# Patient Record
Sex: Female | Born: 1940
Health system: Southern US, Community
[De-identification: ages and names within clinical notes are randomized; demographics above are authoritative.]

## PROBLEM LIST (undated history)

## (undated) DIAGNOSIS — I639 Cerebral infarction, unspecified: Secondary | ICD-10-CM

## (undated) DIAGNOSIS — G8929 Other chronic pain: Secondary | ICD-10-CM

## (undated) DIAGNOSIS — Z87442 Personal history of urinary calculi: Secondary | ICD-10-CM

## (undated) DIAGNOSIS — F329 Major depressive disorder, single episode, unspecified: Secondary | ICD-10-CM

## (undated) DIAGNOSIS — E0590 Thyrotoxicosis, unspecified without thyrotoxic crisis or storm: Secondary | ICD-10-CM

## (undated) DIAGNOSIS — G43909 Migraine, unspecified, not intractable, without status migrainosus: Secondary | ICD-10-CM

## (undated) DIAGNOSIS — I10 Essential (primary) hypertension: Secondary | ICD-10-CM

## (undated) DIAGNOSIS — M797 Fibromyalgia: Secondary | ICD-10-CM

## (undated) DIAGNOSIS — F32A Depression, unspecified: Secondary | ICD-10-CM

## (undated) DIAGNOSIS — I82409 Acute embolism and thrombosis of unspecified deep veins of unspecified lower extremity: Secondary | ICD-10-CM

## (undated) DIAGNOSIS — N189 Chronic kidney disease, unspecified: Secondary | ICD-10-CM

## (undated) DIAGNOSIS — G459 Transient cerebral ischemic attack, unspecified: Secondary | ICD-10-CM

## (undated) DIAGNOSIS — M545 Low back pain, unspecified: Secondary | ICD-10-CM

## (undated) DIAGNOSIS — E119 Type 2 diabetes mellitus without complications: Secondary | ICD-10-CM

## (undated) DIAGNOSIS — R569 Unspecified convulsions: Secondary | ICD-10-CM

## (undated) DIAGNOSIS — J189 Pneumonia, unspecified organism: Secondary | ICD-10-CM

## (undated) HISTORY — PX: TOTAL ABDOMINAL HYSTERECTOMY: SHX209

## (undated) HISTORY — PX: HERNIA REPAIR: SHX51

## (undated) HISTORY — PX: UMBILICAL HERNIA REPAIR: SHX196

## (undated) HISTORY — PX: CARPAL TUNNEL RELEASE: SHX101

## (undated) HISTORY — PX: DILATION AND CURETTAGE OF UTERUS: SHX78

## (undated) HISTORY — PX: CHOLECYSTECTOMY OPEN: SUR202

## (undated) HISTORY — PX: HEMORRHOID SURGERY: SHX153

---

## 2011-08-07 DIAGNOSIS — R112 Nausea with vomiting, unspecified: Secondary | ICD-10-CM | POA: Diagnosis not present

## 2011-08-07 DIAGNOSIS — M25519 Pain in unspecified shoulder: Secondary | ICD-10-CM | POA: Diagnosis not present

## 2011-08-07 DIAGNOSIS — E039 Hypothyroidism, unspecified: Secondary | ICD-10-CM | POA: Diagnosis not present

## 2011-08-07 DIAGNOSIS — E119 Type 2 diabetes mellitus without complications: Secondary | ICD-10-CM | POA: Diagnosis not present

## 2011-09-05 DIAGNOSIS — L03319 Cellulitis of trunk, unspecified: Secondary | ICD-10-CM | POA: Diagnosis not present

## 2011-09-05 DIAGNOSIS — M67919 Unspecified disorder of synovium and tendon, unspecified shoulder: Secondary | ICD-10-CM | POA: Diagnosis not present

## 2011-09-05 DIAGNOSIS — L02219 Cutaneous abscess of trunk, unspecified: Secondary | ICD-10-CM | POA: Diagnosis not present

## 2011-09-05 DIAGNOSIS — L0390 Cellulitis, unspecified: Secondary | ICD-10-CM | POA: Diagnosis not present

## 2011-09-05 DIAGNOSIS — M719 Bursopathy, unspecified: Secondary | ICD-10-CM | POA: Diagnosis not present

## 2011-09-05 DIAGNOSIS — L570 Actinic keratosis: Secondary | ICD-10-CM | POA: Diagnosis not present

## 2011-10-22 DIAGNOSIS — R0989 Other specified symptoms and signs involving the circulatory and respiratory systems: Secondary | ICD-10-CM | POA: Diagnosis not present

## 2011-10-22 DIAGNOSIS — G4700 Insomnia, unspecified: Secondary | ICD-10-CM | POA: Diagnosis not present

## 2011-10-22 DIAGNOSIS — R05 Cough: Secondary | ICD-10-CM | POA: Diagnosis not present

## 2011-10-22 DIAGNOSIS — J019 Acute sinusitis, unspecified: Secondary | ICD-10-CM | POA: Diagnosis not present

## 2011-10-22 DIAGNOSIS — E039 Hypothyroidism, unspecified: Secondary | ICD-10-CM | POA: Diagnosis not present

## 2011-10-22 DIAGNOSIS — R059 Cough, unspecified: Secondary | ICD-10-CM | POA: Diagnosis not present

## 2011-10-31 DIAGNOSIS — I10 Essential (primary) hypertension: Secondary | ICD-10-CM | POA: Diagnosis not present

## 2011-10-31 DIAGNOSIS — R059 Cough, unspecified: Secondary | ICD-10-CM | POA: Diagnosis not present

## 2011-10-31 DIAGNOSIS — R5381 Other malaise: Secondary | ICD-10-CM | POA: Diagnosis not present

## 2011-10-31 DIAGNOSIS — E039 Hypothyroidism, unspecified: Secondary | ICD-10-CM | POA: Diagnosis not present

## 2011-10-31 DIAGNOSIS — E119 Type 2 diabetes mellitus without complications: Secondary | ICD-10-CM | POA: Diagnosis not present

## 2011-10-31 DIAGNOSIS — R05 Cough: Secondary | ICD-10-CM | POA: Diagnosis not present

## 2011-10-31 DIAGNOSIS — R5383 Other fatigue: Secondary | ICD-10-CM | POA: Diagnosis not present

## 2011-10-31 DIAGNOSIS — R112 Nausea with vomiting, unspecified: Secondary | ICD-10-CM | POA: Diagnosis not present

## 2011-11-03 DIAGNOSIS — R05 Cough: Secondary | ICD-10-CM | POA: Diagnosis not present

## 2011-11-03 DIAGNOSIS — I10 Essential (primary) hypertension: Secondary | ICD-10-CM | POA: Diagnosis not present

## 2011-11-03 DIAGNOSIS — E119 Type 2 diabetes mellitus without complications: Secondary | ICD-10-CM | POA: Diagnosis not present

## 2011-11-03 DIAGNOSIS — R112 Nausea with vomiting, unspecified: Secondary | ICD-10-CM | POA: Diagnosis not present

## 2012-02-05 DIAGNOSIS — M25569 Pain in unspecified knee: Secondary | ICD-10-CM | POA: Diagnosis not present

## 2012-02-05 DIAGNOSIS — R0602 Shortness of breath: Secondary | ICD-10-CM | POA: Diagnosis not present

## 2012-02-05 DIAGNOSIS — G4700 Insomnia, unspecified: Secondary | ICD-10-CM | POA: Diagnosis not present

## 2012-02-05 DIAGNOSIS — E119 Type 2 diabetes mellitus without complications: Secondary | ICD-10-CM | POA: Diagnosis not present

## 2012-02-24 DIAGNOSIS — E119 Type 2 diabetes mellitus without complications: Secondary | ICD-10-CM | POA: Diagnosis not present

## 2012-02-24 DIAGNOSIS — R350 Frequency of micturition: Secondary | ICD-10-CM | POA: Diagnosis not present

## 2012-02-24 DIAGNOSIS — I10 Essential (primary) hypertension: Secondary | ICD-10-CM | POA: Diagnosis not present

## 2012-02-24 DIAGNOSIS — R809 Proteinuria, unspecified: Secondary | ICD-10-CM | POA: Diagnosis not present

## 2012-03-11 DIAGNOSIS — E1049 Type 1 diabetes mellitus with other diabetic neurological complication: Secondary | ICD-10-CM | POA: Diagnosis not present

## 2012-03-11 DIAGNOSIS — N898 Other specified noninflammatory disorders of vagina: Secondary | ICD-10-CM | POA: Diagnosis not present

## 2012-03-11 DIAGNOSIS — E119 Type 2 diabetes mellitus without complications: Secondary | ICD-10-CM | POA: Diagnosis not present

## 2012-03-11 DIAGNOSIS — I739 Peripheral vascular disease, unspecified: Secondary | ICD-10-CM | POA: Diagnosis not present

## 2012-03-12 DIAGNOSIS — E119 Type 2 diabetes mellitus without complications: Secondary | ICD-10-CM | POA: Diagnosis not present

## 2012-03-12 DIAGNOSIS — N898 Other specified noninflammatory disorders of vagina: Secondary | ICD-10-CM | POA: Diagnosis not present

## 2012-03-12 DIAGNOSIS — I739 Peripheral vascular disease, unspecified: Secondary | ICD-10-CM | POA: Diagnosis not present

## 2012-03-12 DIAGNOSIS — E1049 Type 1 diabetes mellitus with other diabetic neurological complication: Secondary | ICD-10-CM | POA: Diagnosis not present

## 2012-04-09 DIAGNOSIS — E039 Hypothyroidism, unspecified: Secondary | ICD-10-CM | POA: Diagnosis not present

## 2012-04-09 DIAGNOSIS — E559 Vitamin D deficiency, unspecified: Secondary | ICD-10-CM | POA: Diagnosis not present

## 2012-04-09 DIAGNOSIS — R5381 Other malaise: Secondary | ICD-10-CM | POA: Diagnosis not present

## 2012-04-09 DIAGNOSIS — R5383 Other fatigue: Secondary | ICD-10-CM | POA: Diagnosis not present

## 2012-04-09 DIAGNOSIS — E119 Type 2 diabetes mellitus without complications: Secondary | ICD-10-CM | POA: Diagnosis not present

## 2012-04-09 DIAGNOSIS — H40009 Preglaucoma, unspecified, unspecified eye: Secondary | ICD-10-CM | POA: Diagnosis not present

## 2012-05-14 DIAGNOSIS — R5381 Other malaise: Secondary | ICD-10-CM | POA: Diagnosis not present

## 2012-05-14 DIAGNOSIS — Z6825 Body mass index (BMI) 25.0-25.9, adult: Secondary | ICD-10-CM | POA: Diagnosis not present

## 2012-05-14 DIAGNOSIS — E119 Type 2 diabetes mellitus without complications: Secondary | ICD-10-CM | POA: Diagnosis not present

## 2012-05-14 DIAGNOSIS — E559 Vitamin D deficiency, unspecified: Secondary | ICD-10-CM | POA: Diagnosis not present

## 2012-05-14 DIAGNOSIS — Z0000 Encounter for general adult medical examination without abnormal findings: Secondary | ICD-10-CM | POA: Diagnosis not present

## 2012-05-14 DIAGNOSIS — E039 Hypothyroidism, unspecified: Secondary | ICD-10-CM | POA: Diagnosis not present

## 2012-05-14 DIAGNOSIS — K900 Celiac disease: Secondary | ICD-10-CM | POA: Diagnosis not present

## 2012-05-14 DIAGNOSIS — Z131 Encounter for screening for diabetes mellitus: Secondary | ICD-10-CM | POA: Diagnosis not present

## 2012-05-14 DIAGNOSIS — R002 Palpitations: Secondary | ICD-10-CM | POA: Diagnosis not present

## 2012-05-14 DIAGNOSIS — R450 Nervousness: Secondary | ICD-10-CM | POA: Diagnosis not present

## 2012-05-14 DIAGNOSIS — E669 Obesity, unspecified: Secondary | ICD-10-CM | POA: Diagnosis not present

## 2012-05-14 DIAGNOSIS — N951 Menopausal and female climacteric states: Secondary | ICD-10-CM | POA: Diagnosis not present

## 2012-05-25 DIAGNOSIS — Z6841 Body Mass Index (BMI) 40.0 and over, adult: Secondary | ICD-10-CM | POA: Diagnosis not present

## 2012-05-25 DIAGNOSIS — R079 Chest pain, unspecified: Secondary | ICD-10-CM | POA: Diagnosis not present

## 2012-05-25 DIAGNOSIS — R0602 Shortness of breath: Secondary | ICD-10-CM | POA: Diagnosis not present

## 2012-05-25 DIAGNOSIS — R5383 Other fatigue: Secondary | ICD-10-CM | POA: Diagnosis not present

## 2012-05-25 DIAGNOSIS — R197 Diarrhea, unspecified: Secondary | ICD-10-CM | POA: Diagnosis not present

## 2012-05-25 DIAGNOSIS — R072 Precordial pain: Secondary | ICD-10-CM | POA: Diagnosis not present

## 2012-05-26 DIAGNOSIS — Z6841 Body Mass Index (BMI) 40.0 and over, adult: Secondary | ICD-10-CM | POA: Diagnosis not present

## 2012-05-26 DIAGNOSIS — E119 Type 2 diabetes mellitus without complications: Secondary | ICD-10-CM | POA: Diagnosis not present

## 2012-05-26 DIAGNOSIS — N189 Chronic kidney disease, unspecified: Secondary | ICD-10-CM | POA: Diagnosis not present

## 2012-05-27 DIAGNOSIS — N189 Chronic kidney disease, unspecified: Secondary | ICD-10-CM | POA: Diagnosis not present

## 2012-05-27 DIAGNOSIS — R5381 Other malaise: Secondary | ICD-10-CM | POA: Diagnosis not present

## 2012-05-27 DIAGNOSIS — Z6841 Body Mass Index (BMI) 40.0 and over, adult: Secondary | ICD-10-CM | POA: Diagnosis not present

## 2012-06-11 DIAGNOSIS — E559 Vitamin D deficiency, unspecified: Secondary | ICD-10-CM | POA: Diagnosis not present

## 2012-06-11 DIAGNOSIS — N189 Chronic kidney disease, unspecified: Secondary | ICD-10-CM | POA: Diagnosis not present

## 2012-06-11 DIAGNOSIS — R5381 Other malaise: Secondary | ICD-10-CM | POA: Diagnosis not present

## 2012-06-11 DIAGNOSIS — Z6841 Body Mass Index (BMI) 40.0 and over, adult: Secondary | ICD-10-CM | POA: Diagnosis not present

## 2012-06-15 DIAGNOSIS — K219 Gastro-esophageal reflux disease without esophagitis: Secondary | ICD-10-CM | POA: Diagnosis not present

## 2012-06-15 DIAGNOSIS — R141 Gas pain: Secondary | ICD-10-CM | POA: Diagnosis not present

## 2012-06-15 DIAGNOSIS — Z6841 Body Mass Index (BMI) 40.0 and over, adult: Secondary | ICD-10-CM | POA: Diagnosis not present

## 2012-06-15 DIAGNOSIS — R143 Flatulence: Secondary | ICD-10-CM | POA: Diagnosis not present

## 2012-06-18 DIAGNOSIS — I10 Essential (primary) hypertension: Secondary | ICD-10-CM | POA: Diagnosis not present

## 2012-06-18 DIAGNOSIS — N189 Chronic kidney disease, unspecified: Secondary | ICD-10-CM | POA: Diagnosis not present

## 2012-06-18 DIAGNOSIS — Z6841 Body Mass Index (BMI) 40.0 and over, adult: Secondary | ICD-10-CM | POA: Diagnosis not present

## 2012-06-21 DIAGNOSIS — R5383 Other fatigue: Secondary | ICD-10-CM | POA: Diagnosis not present

## 2012-06-21 DIAGNOSIS — N19 Unspecified kidney failure: Secondary | ICD-10-CM | POA: Diagnosis not present

## 2012-06-21 DIAGNOSIS — Z6841 Body Mass Index (BMI) 40.0 and over, adult: Secondary | ICD-10-CM | POA: Diagnosis not present

## 2012-06-21 DIAGNOSIS — E119 Type 2 diabetes mellitus without complications: Secondary | ICD-10-CM | POA: Diagnosis not present

## 2012-06-23 DIAGNOSIS — E875 Hyperkalemia: Secondary | ICD-10-CM | POA: Diagnosis not present

## 2012-06-23 DIAGNOSIS — E119 Type 2 diabetes mellitus without complications: Secondary | ICD-10-CM | POA: Diagnosis not present

## 2012-06-23 DIAGNOSIS — Z6841 Body Mass Index (BMI) 40.0 and over, adult: Secondary | ICD-10-CM | POA: Diagnosis not present

## 2012-06-23 DIAGNOSIS — N189 Chronic kidney disease, unspecified: Secondary | ICD-10-CM | POA: Diagnosis not present

## 2012-07-14 DIAGNOSIS — R5381 Other malaise: Secondary | ICD-10-CM | POA: Diagnosis not present

## 2012-07-14 DIAGNOSIS — I10 Essential (primary) hypertension: Secondary | ICD-10-CM | POA: Diagnosis not present

## 2012-07-14 DIAGNOSIS — R079 Chest pain, unspecified: Secondary | ICD-10-CM | POA: Diagnosis not present

## 2012-07-14 DIAGNOSIS — Z6841 Body Mass Index (BMI) 40.0 and over, adult: Secondary | ICD-10-CM | POA: Diagnosis not present

## 2012-07-14 DIAGNOSIS — R5383 Other fatigue: Secondary | ICD-10-CM | POA: Diagnosis not present

## 2012-07-14 DIAGNOSIS — N19 Unspecified kidney failure: Secondary | ICD-10-CM | POA: Diagnosis not present

## 2012-07-14 DIAGNOSIS — R072 Precordial pain: Secondary | ICD-10-CM | POA: Diagnosis not present

## 2012-07-15 DIAGNOSIS — Z6841 Body Mass Index (BMI) 40.0 and over, adult: Secondary | ICD-10-CM | POA: Diagnosis not present

## 2012-07-15 DIAGNOSIS — R079 Chest pain, unspecified: Secondary | ICD-10-CM | POA: Diagnosis not present

## 2012-07-15 DIAGNOSIS — E109 Type 1 diabetes mellitus without complications: Secondary | ICD-10-CM | POA: Diagnosis not present

## 2012-07-15 DIAGNOSIS — R809 Proteinuria, unspecified: Secondary | ICD-10-CM | POA: Diagnosis not present

## 2012-07-15 DIAGNOSIS — R350 Frequency of micturition: Secondary | ICD-10-CM | POA: Diagnosis not present

## 2012-07-21 DIAGNOSIS — R079 Chest pain, unspecified: Secondary | ICD-10-CM | POA: Diagnosis not present

## 2012-08-06 DIAGNOSIS — N19 Unspecified kidney failure: Secondary | ICD-10-CM | POA: Diagnosis not present

## 2012-08-06 DIAGNOSIS — Z6841 Body Mass Index (BMI) 40.0 and over, adult: Secondary | ICD-10-CM | POA: Diagnosis not present

## 2012-08-06 DIAGNOSIS — R0602 Shortness of breath: Secondary | ICD-10-CM | POA: Diagnosis not present

## 2012-08-06 DIAGNOSIS — E109 Type 1 diabetes mellitus without complications: Secondary | ICD-10-CM | POA: Diagnosis not present

## 2012-08-12 DIAGNOSIS — M810 Age-related osteoporosis without current pathological fracture: Secondary | ICD-10-CM | POA: Diagnosis not present

## 2012-08-12 DIAGNOSIS — M539 Dorsopathy, unspecified: Secondary | ICD-10-CM | POA: Diagnosis not present

## 2012-08-12 DIAGNOSIS — R809 Proteinuria, unspecified: Secondary | ICD-10-CM | POA: Diagnosis not present

## 2012-08-12 DIAGNOSIS — R0602 Shortness of breath: Secondary | ICD-10-CM | POA: Diagnosis not present

## 2012-08-12 DIAGNOSIS — N19 Unspecified kidney failure: Secondary | ICD-10-CM | POA: Diagnosis not present

## 2012-10-19 DIAGNOSIS — H25019 Cortical age-related cataract, unspecified eye: Secondary | ICD-10-CM | POA: Diagnosis not present

## 2012-10-19 DIAGNOSIS — H2510 Age-related nuclear cataract, unspecified eye: Secondary | ICD-10-CM | POA: Diagnosis not present

## 2012-11-04 DIAGNOSIS — R05 Cough: Secondary | ICD-10-CM | POA: Diagnosis not present

## 2012-11-04 DIAGNOSIS — R059 Cough, unspecified: Secondary | ICD-10-CM | POA: Diagnosis not present

## 2012-11-04 DIAGNOSIS — J209 Acute bronchitis, unspecified: Secondary | ICD-10-CM | POA: Diagnosis not present

## 2012-11-04 DIAGNOSIS — E109 Type 1 diabetes mellitus without complications: Secondary | ICD-10-CM | POA: Diagnosis not present

## 2012-11-04 DIAGNOSIS — J111 Influenza due to unidentified influenza virus with other respiratory manifestations: Secondary | ICD-10-CM | POA: Diagnosis not present

## 2012-11-12 DIAGNOSIS — E109 Type 1 diabetes mellitus without complications: Secondary | ICD-10-CM | POA: Diagnosis not present

## 2012-11-12 DIAGNOSIS — Z6841 Body Mass Index (BMI) 40.0 and over, adult: Secondary | ICD-10-CM | POA: Diagnosis not present

## 2012-11-12 DIAGNOSIS — J209 Acute bronchitis, unspecified: Secondary | ICD-10-CM | POA: Diagnosis not present

## 2012-11-18 DIAGNOSIS — L570 Actinic keratosis: Secondary | ICD-10-CM | POA: Diagnosis not present

## 2012-12-28 DIAGNOSIS — R1031 Right lower quadrant pain: Secondary | ICD-10-CM | POA: Diagnosis not present

## 2012-12-28 DIAGNOSIS — I2510 Atherosclerotic heart disease of native coronary artery without angina pectoris: Secondary | ICD-10-CM | POA: Diagnosis not present

## 2012-12-28 DIAGNOSIS — R1011 Right upper quadrant pain: Secondary | ICD-10-CM | POA: Diagnosis not present

## 2012-12-28 DIAGNOSIS — R1013 Epigastric pain: Secondary | ICD-10-CM | POA: Diagnosis not present

## 2012-12-29 DIAGNOSIS — R609 Edema, unspecified: Secondary | ICD-10-CM | POA: Diagnosis not present

## 2012-12-29 DIAGNOSIS — I82819 Embolism and thrombosis of superficial veins of unspecified lower extremities: Secondary | ICD-10-CM | POA: Diagnosis not present

## 2013-02-04 DIAGNOSIS — M539 Dorsopathy, unspecified: Secondary | ICD-10-CM | POA: Diagnosis not present

## 2013-02-04 DIAGNOSIS — E109 Type 1 diabetes mellitus without complications: Secondary | ICD-10-CM | POA: Diagnosis not present

## 2013-02-04 DIAGNOSIS — R51 Headache: Secondary | ICD-10-CM | POA: Diagnosis not present

## 2013-02-08 DIAGNOSIS — Z1211 Encounter for screening for malignant neoplasm of colon: Secondary | ICD-10-CM | POA: Diagnosis not present

## 2013-02-08 DIAGNOSIS — K591 Functional diarrhea: Secondary | ICD-10-CM | POA: Diagnosis not present

## 2013-02-09 DIAGNOSIS — R197 Diarrhea, unspecified: Secondary | ICD-10-CM | POA: Diagnosis not present

## 2013-03-08 DIAGNOSIS — R609 Edema, unspecified: Secondary | ICD-10-CM | POA: Diagnosis not present

## 2013-03-08 DIAGNOSIS — R072 Precordial pain: Secondary | ICD-10-CM | POA: Diagnosis not present

## 2013-03-08 DIAGNOSIS — E109 Type 1 diabetes mellitus without complications: Secondary | ICD-10-CM | POA: Diagnosis not present

## 2013-03-08 DIAGNOSIS — E039 Hypothyroidism, unspecified: Secondary | ICD-10-CM | POA: Diagnosis not present

## 2013-03-08 DIAGNOSIS — R0602 Shortness of breath: Secondary | ICD-10-CM | POA: Diagnosis not present

## 2013-03-08 DIAGNOSIS — R079 Chest pain, unspecified: Secondary | ICD-10-CM | POA: Diagnosis not present

## 2013-03-08 DIAGNOSIS — R5381 Other malaise: Secondary | ICD-10-CM | POA: Diagnosis not present

## 2013-03-09 DIAGNOSIS — Z6841 Body Mass Index (BMI) 40.0 and over, adult: Secondary | ICD-10-CM | POA: Diagnosis not present

## 2013-03-09 DIAGNOSIS — Z23 Encounter for immunization: Secondary | ICD-10-CM | POA: Diagnosis not present

## 2013-03-09 DIAGNOSIS — Z713 Dietary counseling and surveillance: Secondary | ICD-10-CM | POA: Diagnosis not present

## 2013-03-09 DIAGNOSIS — E663 Overweight: Secondary | ICD-10-CM | POA: Diagnosis not present

## 2013-03-09 DIAGNOSIS — N19 Unspecified kidney failure: Secondary | ICD-10-CM | POA: Diagnosis not present

## 2013-03-09 DIAGNOSIS — I509 Heart failure, unspecified: Secondary | ICD-10-CM | POA: Diagnosis not present

## 2013-03-09 DIAGNOSIS — R0602 Shortness of breath: Secondary | ICD-10-CM | POA: Diagnosis not present

## 2013-03-10 DIAGNOSIS — R1084 Generalized abdominal pain: Secondary | ICD-10-CM | POA: Diagnosis not present

## 2013-03-10 DIAGNOSIS — I2510 Atherosclerotic heart disease of native coronary artery without angina pectoris: Secondary | ICD-10-CM | POA: Diagnosis not present

## 2013-03-10 DIAGNOSIS — I82409 Acute embolism and thrombosis of unspecified deep veins of unspecified lower extremity: Secondary | ICD-10-CM | POA: Diagnosis not present

## 2013-03-10 DIAGNOSIS — E109 Type 1 diabetes mellitus without complications: Secondary | ICD-10-CM | POA: Diagnosis not present

## 2013-03-11 DIAGNOSIS — R0602 Shortness of breath: Secondary | ICD-10-CM | POA: Diagnosis not present

## 2013-03-14 DIAGNOSIS — R109 Unspecified abdominal pain: Secondary | ICD-10-CM | POA: Diagnosis not present

## 2013-03-14 DIAGNOSIS — K439 Ventral hernia without obstruction or gangrene: Secondary | ICD-10-CM | POA: Diagnosis not present

## 2013-03-17 DIAGNOSIS — I10 Essential (primary) hypertension: Secondary | ICD-10-CM | POA: Diagnosis not present

## 2013-03-17 DIAGNOSIS — R1032 Left lower quadrant pain: Secondary | ICD-10-CM | POA: Diagnosis not present

## 2013-03-17 DIAGNOSIS — I82629 Acute embolism and thrombosis of deep veins of unspecified upper extremity: Secondary | ICD-10-CM | POA: Diagnosis not present

## 2013-03-17 DIAGNOSIS — E109 Type 1 diabetes mellitus without complications: Secondary | ICD-10-CM | POA: Diagnosis not present

## 2013-03-18 DIAGNOSIS — R29898 Other symptoms and signs involving the musculoskeletal system: Secondary | ICD-10-CM | POA: Diagnosis not present

## 2013-03-18 DIAGNOSIS — I739 Peripheral vascular disease, unspecified: Secondary | ICD-10-CM | POA: Diagnosis not present

## 2013-03-18 DIAGNOSIS — E109 Type 1 diabetes mellitus without complications: Secondary | ICD-10-CM | POA: Diagnosis not present

## 2013-03-18 DIAGNOSIS — I749 Embolism and thrombosis of unspecified artery: Secondary | ICD-10-CM | POA: Diagnosis not present

## 2013-03-18 DIAGNOSIS — R350 Frequency of micturition: Secondary | ICD-10-CM | POA: Diagnosis not present

## 2013-03-24 DIAGNOSIS — Z5181 Encounter for therapeutic drug level monitoring: Secondary | ICD-10-CM | POA: Diagnosis not present

## 2013-03-24 DIAGNOSIS — E162 Hypoglycemia, unspecified: Secondary | ICD-10-CM | POA: Diagnosis not present

## 2013-03-24 DIAGNOSIS — E1049 Type 1 diabetes mellitus with other diabetic neurological complication: Secondary | ICD-10-CM | POA: Diagnosis not present

## 2013-03-24 DIAGNOSIS — E669 Obesity, unspecified: Secondary | ICD-10-CM | POA: Diagnosis not present

## 2013-03-24 DIAGNOSIS — R29898 Other symptoms and signs involving the musculoskeletal system: Secondary | ICD-10-CM | POA: Diagnosis not present

## 2013-05-19 DIAGNOSIS — E785 Hyperlipidemia, unspecified: Secondary | ICD-10-CM | POA: Diagnosis not present

## 2013-05-19 DIAGNOSIS — E109 Type 1 diabetes mellitus without complications: Secondary | ICD-10-CM | POA: Diagnosis not present

## 2013-05-19 DIAGNOSIS — E663 Overweight: Secondary | ICD-10-CM | POA: Diagnosis not present

## 2013-05-19 DIAGNOSIS — E559 Vitamin D deficiency, unspecified: Secondary | ICD-10-CM | POA: Diagnosis not present

## 2013-05-19 DIAGNOSIS — Z5181 Encounter for therapeutic drug level monitoring: Secondary | ICD-10-CM | POA: Diagnosis not present

## 2013-05-19 DIAGNOSIS — Z713 Dietary counseling and surveillance: Secondary | ICD-10-CM | POA: Diagnosis not present

## 2013-05-19 DIAGNOSIS — Z6841 Body Mass Index (BMI) 40.0 and over, adult: Secondary | ICD-10-CM | POA: Diagnosis not present

## 2013-05-19 DIAGNOSIS — E039 Hypothyroidism, unspecified: Secondary | ICD-10-CM | POA: Diagnosis not present

## 2013-05-19 DIAGNOSIS — Z1331 Encounter for screening for depression: Secondary | ICD-10-CM | POA: Diagnosis not present

## 2013-05-19 DIAGNOSIS — R5381 Other malaise: Secondary | ICD-10-CM | POA: Diagnosis not present

## 2013-06-20 DIAGNOSIS — R791 Abnormal coagulation profile: Secondary | ICD-10-CM | POA: Diagnosis not present

## 2013-06-20 DIAGNOSIS — I509 Heart failure, unspecified: Secondary | ICD-10-CM | POA: Diagnosis not present

## 2013-06-20 DIAGNOSIS — I10 Essential (primary) hypertension: Secondary | ICD-10-CM | POA: Diagnosis not present

## 2013-06-20 DIAGNOSIS — Z79899 Other long term (current) drug therapy: Secondary | ICD-10-CM | POA: Diagnosis not present

## 2013-06-20 DIAGNOSIS — M25519 Pain in unspecified shoulder: Secondary | ICD-10-CM | POA: Diagnosis not present

## 2013-06-20 DIAGNOSIS — S4980XA Other specified injuries of shoulder and upper arm, unspecified arm, initial encounter: Secondary | ICD-10-CM | POA: Diagnosis not present

## 2013-06-20 DIAGNOSIS — F0390 Unspecified dementia without behavioral disturbance: Secondary | ICD-10-CM | POA: Diagnosis not present

## 2013-06-20 DIAGNOSIS — M7989 Other specified soft tissue disorders: Secondary | ICD-10-CM | POA: Diagnosis not present

## 2013-06-20 DIAGNOSIS — S0990XA Unspecified injury of head, initial encounter: Secondary | ICD-10-CM | POA: Diagnosis not present

## 2013-06-20 DIAGNOSIS — S8990XA Unspecified injury of unspecified lower leg, initial encounter: Secondary | ICD-10-CM | POA: Diagnosis not present

## 2013-06-20 DIAGNOSIS — Z5181 Encounter for therapeutic drug level monitoring: Secondary | ICD-10-CM | POA: Diagnosis not present

## 2013-06-20 DIAGNOSIS — Z0000 Encounter for general adult medical examination without abnormal findings: Secondary | ICD-10-CM | POA: Diagnosis not present

## 2013-06-20 DIAGNOSIS — E119 Type 2 diabetes mellitus without complications: Secondary | ICD-10-CM | POA: Diagnosis not present

## 2013-06-20 DIAGNOSIS — M25569 Pain in unspecified knee: Secondary | ICD-10-CM | POA: Diagnosis not present

## 2013-06-20 DIAGNOSIS — E109 Type 1 diabetes mellitus without complications: Secondary | ICD-10-CM | POA: Diagnosis not present

## 2013-06-20 DIAGNOSIS — Z794 Long term (current) use of insulin: Secondary | ICD-10-CM | POA: Diagnosis not present

## 2013-06-20 DIAGNOSIS — Z7901 Long term (current) use of anticoagulants: Secondary | ICD-10-CM | POA: Diagnosis not present

## 2013-06-20 DIAGNOSIS — T148XXA Other injury of unspecified body region, initial encounter: Secondary | ICD-10-CM | POA: Diagnosis not present

## 2013-06-20 DIAGNOSIS — W010XXA Fall on same level from slipping, tripping and stumbling without subsequent striking against object, initial encounter: Secondary | ICD-10-CM | POA: Diagnosis not present

## 2013-06-20 DIAGNOSIS — G8929 Other chronic pain: Secondary | ICD-10-CM | POA: Diagnosis not present

## 2013-06-20 DIAGNOSIS — IMO0002 Reserved for concepts with insufficient information to code with codable children: Secondary | ICD-10-CM | POA: Diagnosis not present

## 2013-07-07 DIAGNOSIS — I639 Cerebral infarction, unspecified: Secondary | ICD-10-CM

## 2013-07-07 HISTORY — PX: CORONARY ANGIOPLASTY: SHX604

## 2013-07-07 HISTORY — DX: Cerebral infarction, unspecified: I63.9

## 2013-08-19 DIAGNOSIS — E109 Type 1 diabetes mellitus without complications: Secondary | ICD-10-CM | POA: Diagnosis not present

## 2013-08-19 DIAGNOSIS — I10 Essential (primary) hypertension: Secondary | ICD-10-CM | POA: Diagnosis not present

## 2013-08-19 DIAGNOSIS — B379 Candidiasis, unspecified: Secondary | ICD-10-CM | POA: Diagnosis not present

## 2013-08-19 DIAGNOSIS — E669 Obesity, unspecified: Secondary | ICD-10-CM | POA: Diagnosis not present

## 2013-08-19 DIAGNOSIS — E039 Hypothyroidism, unspecified: Secondary | ICD-10-CM | POA: Diagnosis not present

## 2013-08-19 DIAGNOSIS — R809 Proteinuria, unspecified: Secondary | ICD-10-CM | POA: Diagnosis not present

## 2013-12-14 DIAGNOSIS — M79609 Pain in unspecified limb: Secondary | ICD-10-CM | POA: Diagnosis not present

## 2013-12-14 DIAGNOSIS — G8929 Other chronic pain: Secondary | ICD-10-CM | POA: Diagnosis not present

## 2013-12-14 DIAGNOSIS — R112 Nausea with vomiting, unspecified: Secondary | ICD-10-CM | POA: Diagnosis not present

## 2013-12-14 DIAGNOSIS — A779 Spotted fever, unspecified: Secondary | ICD-10-CM | POA: Diagnosis not present

## 2013-12-14 DIAGNOSIS — R5381 Other malaise: Secondary | ICD-10-CM | POA: Diagnosis not present

## 2013-12-14 DIAGNOSIS — R5383 Other fatigue: Secondary | ICD-10-CM | POA: Diagnosis not present

## 2013-12-20 DIAGNOSIS — R0989 Other specified symptoms and signs involving the circulatory and respiratory systems: Secondary | ICD-10-CM | POA: Diagnosis not present

## 2013-12-20 DIAGNOSIS — M940 Chondrocostal junction syndrome [Tietze]: Secondary | ICD-10-CM | POA: Diagnosis not present

## 2013-12-20 DIAGNOSIS — E039 Hypothyroidism, unspecified: Secondary | ICD-10-CM | POA: Diagnosis not present

## 2013-12-20 DIAGNOSIS — R5383 Other fatigue: Secondary | ICD-10-CM | POA: Diagnosis not present

## 2013-12-20 DIAGNOSIS — R5381 Other malaise: Secondary | ICD-10-CM | POA: Diagnosis not present

## 2013-12-20 DIAGNOSIS — K219 Gastro-esophageal reflux disease without esophagitis: Secondary | ICD-10-CM | POA: Diagnosis not present

## 2013-12-20 DIAGNOSIS — R0602 Shortness of breath: Secondary | ICD-10-CM | POA: Diagnosis not present

## 2013-12-20 DIAGNOSIS — M2550 Pain in unspecified joint: Secondary | ICD-10-CM | POA: Diagnosis not present

## 2013-12-22 DIAGNOSIS — K219 Gastro-esophageal reflux disease without esophagitis: Secondary | ICD-10-CM | POA: Diagnosis not present

## 2013-12-22 DIAGNOSIS — Z713 Dietary counseling and surveillance: Secondary | ICD-10-CM | POA: Diagnosis not present

## 2013-12-22 DIAGNOSIS — E109 Type 1 diabetes mellitus without complications: Secondary | ICD-10-CM | POA: Diagnosis not present

## 2013-12-22 DIAGNOSIS — R112 Nausea with vomiting, unspecified: Secondary | ICD-10-CM | POA: Diagnosis not present

## 2013-12-22 DIAGNOSIS — R5381 Other malaise: Secondary | ICD-10-CM | POA: Diagnosis not present

## 2013-12-22 DIAGNOSIS — A048 Other specified bacterial intestinal infections: Secondary | ICD-10-CM | POA: Diagnosis not present

## 2013-12-27 DIAGNOSIS — R5381 Other malaise: Secondary | ICD-10-CM | POA: Diagnosis not present

## 2013-12-27 DIAGNOSIS — M940 Chondrocostal junction syndrome [Tietze]: Secondary | ICD-10-CM | POA: Diagnosis not present

## 2013-12-27 DIAGNOSIS — R5383 Other fatigue: Secondary | ICD-10-CM | POA: Diagnosis not present

## 2013-12-27 DIAGNOSIS — K219 Gastro-esophageal reflux disease without esophagitis: Secondary | ICD-10-CM | POA: Diagnosis not present

## 2013-12-27 DIAGNOSIS — E109 Type 1 diabetes mellitus without complications: Secondary | ICD-10-CM | POA: Diagnosis not present

## 2014-01-03 DIAGNOSIS — R079 Chest pain, unspecified: Secondary | ICD-10-CM | POA: Diagnosis not present

## 2014-01-03 DIAGNOSIS — E109 Type 1 diabetes mellitus without complications: Secondary | ICD-10-CM | POA: Diagnosis not present

## 2014-01-03 DIAGNOSIS — R5381 Other malaise: Secondary | ICD-10-CM | POA: Diagnosis not present

## 2014-01-03 DIAGNOSIS — K219 Gastro-esophageal reflux disease without esophagitis: Secondary | ICD-10-CM | POA: Diagnosis not present

## 2014-01-03 DIAGNOSIS — I10 Essential (primary) hypertension: Secondary | ICD-10-CM | POA: Diagnosis not present

## 2014-01-03 DIAGNOSIS — G8929 Other chronic pain: Secondary | ICD-10-CM | POA: Diagnosis not present

## 2014-01-03 DIAGNOSIS — R5383 Other fatigue: Secondary | ICD-10-CM | POA: Diagnosis not present

## 2014-01-04 DIAGNOSIS — K219 Gastro-esophageal reflux disease without esophagitis: Secondary | ICD-10-CM | POA: Diagnosis not present

## 2014-01-04 DIAGNOSIS — I10 Essential (primary) hypertension: Secondary | ICD-10-CM | POA: Diagnosis not present

## 2014-01-04 DIAGNOSIS — G8929 Other chronic pain: Secondary | ICD-10-CM | POA: Diagnosis not present

## 2014-01-04 DIAGNOSIS — E109 Type 1 diabetes mellitus without complications: Secondary | ICD-10-CM | POA: Diagnosis not present

## 2014-01-18 DIAGNOSIS — IMO0001 Reserved for inherently not codable concepts without codable children: Secondary | ICD-10-CM | POA: Diagnosis not present

## 2014-01-18 DIAGNOSIS — R062 Wheezing: Secondary | ICD-10-CM | POA: Diagnosis not present

## 2014-01-18 DIAGNOSIS — R0602 Shortness of breath: Secondary | ICD-10-CM | POA: Diagnosis not present

## 2014-01-18 DIAGNOSIS — I214 Non-ST elevation (NSTEMI) myocardial infarction: Secondary | ICD-10-CM | POA: Diagnosis not present

## 2014-01-18 DIAGNOSIS — R609 Edema, unspecified: Secondary | ICD-10-CM | POA: Diagnosis not present

## 2014-01-18 DIAGNOSIS — R072 Precordial pain: Secondary | ICD-10-CM | POA: Diagnosis not present

## 2014-01-18 DIAGNOSIS — R079 Chest pain, unspecified: Secondary | ICD-10-CM | POA: Diagnosis not present

## 2014-01-19 DIAGNOSIS — R9431 Abnormal electrocardiogram [ECG] [EKG]: Secondary | ICD-10-CM | POA: Diagnosis not present

## 2014-01-19 DIAGNOSIS — I059 Rheumatic mitral valve disease, unspecified: Secondary | ICD-10-CM | POA: Diagnosis not present

## 2014-01-19 DIAGNOSIS — I359 Nonrheumatic aortic valve disorder, unspecified: Secondary | ICD-10-CM | POA: Diagnosis not present

## 2014-01-19 DIAGNOSIS — R609 Edema, unspecified: Secondary | ICD-10-CM | POA: Diagnosis not present

## 2014-01-19 DIAGNOSIS — R0602 Shortness of breath: Secondary | ICD-10-CM | POA: Diagnosis not present

## 2014-01-19 DIAGNOSIS — Z7901 Long term (current) use of anticoagulants: Secondary | ICD-10-CM | POA: Diagnosis not present

## 2014-01-19 DIAGNOSIS — R0789 Other chest pain: Secondary | ICD-10-CM | POA: Diagnosis not present

## 2014-01-19 DIAGNOSIS — E039 Hypothyroidism, unspecified: Secondary | ICD-10-CM | POA: Diagnosis present

## 2014-01-19 DIAGNOSIS — R079 Chest pain, unspecified: Secondary | ICD-10-CM | POA: Diagnosis not present

## 2014-01-19 DIAGNOSIS — I200 Unstable angina: Secondary | ICD-10-CM | POA: Diagnosis not present

## 2014-01-19 DIAGNOSIS — R072 Precordial pain: Secondary | ICD-10-CM | POA: Diagnosis not present

## 2014-01-19 DIAGNOSIS — E669 Obesity, unspecified: Secondary | ICD-10-CM | POA: Diagnosis not present

## 2014-01-19 DIAGNOSIS — Z7982 Long term (current) use of aspirin: Secondary | ICD-10-CM | POA: Diagnosis not present

## 2014-01-19 DIAGNOSIS — I10 Essential (primary) hypertension: Secondary | ICD-10-CM | POA: Diagnosis not present

## 2014-01-19 DIAGNOSIS — Z86718 Personal history of other venous thrombosis and embolism: Secondary | ICD-10-CM | POA: Diagnosis not present

## 2014-01-19 DIAGNOSIS — M129 Arthropathy, unspecified: Secondary | ICD-10-CM | POA: Diagnosis not present

## 2014-01-19 DIAGNOSIS — IMO0001 Reserved for inherently not codable concepts without codable children: Secondary | ICD-10-CM | POA: Diagnosis not present

## 2014-01-19 DIAGNOSIS — Z79899 Other long term (current) drug therapy: Secondary | ICD-10-CM | POA: Diagnosis not present

## 2014-01-19 DIAGNOSIS — R062 Wheezing: Secondary | ICD-10-CM | POA: Diagnosis not present

## 2014-01-19 DIAGNOSIS — Z79890 Hormone replacement therapy (postmenopausal): Secondary | ICD-10-CM | POA: Diagnosis not present

## 2014-01-20 DIAGNOSIS — R072 Precordial pain: Secondary | ICD-10-CM | POA: Diagnosis not present

## 2014-01-20 DIAGNOSIS — Z7901 Long term (current) use of anticoagulants: Secondary | ICD-10-CM | POA: Diagnosis not present

## 2014-01-20 DIAGNOSIS — N289 Disorder of kidney and ureter, unspecified: Secondary | ICD-10-CM | POA: Diagnosis present

## 2014-01-20 DIAGNOSIS — I10 Essential (primary) hypertension: Secondary | ICD-10-CM | POA: Diagnosis present

## 2014-01-20 DIAGNOSIS — IMO0001 Reserved for inherently not codable concepts without codable children: Secondary | ICD-10-CM | POA: Diagnosis not present

## 2014-01-20 DIAGNOSIS — F3289 Other specified depressive episodes: Secondary | ICD-10-CM | POA: Diagnosis not present

## 2014-01-20 DIAGNOSIS — F329 Major depressive disorder, single episode, unspecified: Secondary | ICD-10-CM | POA: Diagnosis not present

## 2014-01-20 DIAGNOSIS — I200 Unstable angina: Secondary | ICD-10-CM | POA: Diagnosis not present

## 2014-01-20 DIAGNOSIS — R4182 Altered mental status, unspecified: Secondary | ICD-10-CM | POA: Diagnosis not present

## 2014-01-20 DIAGNOSIS — E785 Hyperlipidemia, unspecified: Secondary | ICD-10-CM | POA: Diagnosis present

## 2014-01-20 DIAGNOSIS — E119 Type 2 diabetes mellitus without complications: Secondary | ICD-10-CM | POA: Diagnosis present

## 2014-01-20 DIAGNOSIS — R9431 Abnormal electrocardiogram [ECG] [EKG]: Secondary | ICD-10-CM | POA: Diagnosis not present

## 2014-01-20 DIAGNOSIS — E669 Obesity, unspecified: Secondary | ICD-10-CM | POA: Diagnosis not present

## 2014-01-20 DIAGNOSIS — I6789 Other cerebrovascular disease: Secondary | ICD-10-CM | POA: Diagnosis not present

## 2014-01-20 DIAGNOSIS — IMO0002 Reserved for concepts with insufficient information to code with codable children: Secondary | ICD-10-CM | POA: Diagnosis not present

## 2014-01-20 DIAGNOSIS — R5381 Other malaise: Secondary | ICD-10-CM | POA: Diagnosis not present

## 2014-01-20 DIAGNOSIS — R55 Syncope and collapse: Secondary | ICD-10-CM | POA: Diagnosis not present

## 2014-01-20 DIAGNOSIS — I6350 Cerebral infarction due to unspecified occlusion or stenosis of unspecified cerebral artery: Secondary | ICD-10-CM | POA: Diagnosis not present

## 2014-01-20 DIAGNOSIS — R079 Chest pain, unspecified: Secondary | ICD-10-CM | POA: Diagnosis not present

## 2014-01-20 DIAGNOSIS — E1149 Type 2 diabetes mellitus with other diabetic neurological complication: Secondary | ICD-10-CM | POA: Diagnosis not present

## 2014-01-20 DIAGNOSIS — I6529 Occlusion and stenosis of unspecified carotid artery: Secondary | ICD-10-CM | POA: Diagnosis not present

## 2014-01-20 DIAGNOSIS — R569 Unspecified convulsions: Secondary | ICD-10-CM | POA: Diagnosis not present

## 2014-01-20 DIAGNOSIS — I369 Nonrheumatic tricuspid valve disorder, unspecified: Secondary | ICD-10-CM | POA: Diagnosis not present

## 2014-01-20 DIAGNOSIS — E1142 Type 2 diabetes mellitus with diabetic polyneuropathy: Secondary | ICD-10-CM | POA: Diagnosis not present

## 2014-01-20 DIAGNOSIS — Z5189 Encounter for other specified aftercare: Secondary | ICD-10-CM | POA: Diagnosis not present

## 2014-01-20 DIAGNOSIS — I2510 Atherosclerotic heart disease of native coronary artery without angina pectoris: Secondary | ICD-10-CM | POA: Diagnosis not present

## 2014-01-20 DIAGNOSIS — F411 Generalized anxiety disorder: Secondary | ICD-10-CM | POA: Diagnosis not present

## 2014-01-20 DIAGNOSIS — I6340 Cerebral infarction due to embolism of unspecified cerebral artery: Secondary | ICD-10-CM | POA: Diagnosis not present

## 2014-01-21 DIAGNOSIS — R4182 Altered mental status, unspecified: Secondary | ICD-10-CM | POA: Diagnosis not present

## 2014-01-23 DIAGNOSIS — I6350 Cerebral infarction due to unspecified occlusion or stenosis of unspecified cerebral artery: Secondary | ICD-10-CM | POA: Diagnosis not present

## 2014-01-23 DIAGNOSIS — R55 Syncope and collapse: Secondary | ICD-10-CM | POA: Diagnosis not present

## 2014-01-23 DIAGNOSIS — I6529 Occlusion and stenosis of unspecified carotid artery: Secondary | ICD-10-CM | POA: Diagnosis not present

## 2014-01-25 DIAGNOSIS — F329 Major depressive disorder, single episode, unspecified: Secondary | ICD-10-CM | POA: Diagnosis not present

## 2014-01-25 DIAGNOSIS — Z8673 Personal history of transient ischemic attack (TIA), and cerebral infarction without residual deficits: Secondary | ICD-10-CM | POA: Diagnosis not present

## 2014-01-25 DIAGNOSIS — R1310 Dysphagia, unspecified: Secondary | ICD-10-CM | POA: Diagnosis not present

## 2014-01-25 DIAGNOSIS — E1142 Type 2 diabetes mellitus with diabetic polyneuropathy: Secondary | ICD-10-CM | POA: Diagnosis not present

## 2014-01-25 DIAGNOSIS — F411 Generalized anxiety disorder: Secondary | ICD-10-CM | POA: Diagnosis not present

## 2014-01-25 DIAGNOSIS — G4700 Insomnia, unspecified: Secondary | ICD-10-CM | POA: Diagnosis not present

## 2014-01-25 DIAGNOSIS — K219 Gastro-esophageal reflux disease without esophagitis: Secondary | ICD-10-CM | POA: Diagnosis not present

## 2014-01-25 DIAGNOSIS — G8929 Other chronic pain: Secondary | ICD-10-CM | POA: Diagnosis not present

## 2014-01-25 DIAGNOSIS — E1149 Type 2 diabetes mellitus with other diabetic neurological complication: Secondary | ICD-10-CM | POA: Diagnosis not present

## 2014-01-25 DIAGNOSIS — F3289 Other specified depressive episodes: Secondary | ICD-10-CM | POA: Diagnosis not present

## 2014-01-25 DIAGNOSIS — I6789 Other cerebrovascular disease: Secondary | ICD-10-CM | POA: Diagnosis not present

## 2014-01-25 DIAGNOSIS — E039 Hypothyroidism, unspecified: Secondary | ICD-10-CM | POA: Diagnosis not present

## 2014-01-25 DIAGNOSIS — I2510 Atherosclerotic heart disease of native coronary artery without angina pectoris: Secondary | ICD-10-CM | POA: Diagnosis not present

## 2014-01-25 DIAGNOSIS — Z5189 Encounter for other specified aftercare: Secondary | ICD-10-CM | POA: Diagnosis not present

## 2014-02-06 DIAGNOSIS — N951 Menopausal and female climacteric states: Secondary | ICD-10-CM | POA: Diagnosis not present

## 2014-02-06 DIAGNOSIS — I119 Hypertensive heart disease without heart failure: Secondary | ICD-10-CM | POA: Diagnosis not present

## 2014-02-06 DIAGNOSIS — E1129 Type 2 diabetes mellitus with other diabetic kidney complication: Secondary | ICD-10-CM | POA: Diagnosis not present

## 2014-02-06 DIAGNOSIS — I669 Occlusion and stenosis of unspecified cerebral artery: Secondary | ICD-10-CM | POA: Diagnosis not present

## 2014-03-03 DIAGNOSIS — I6529 Occlusion and stenosis of unspecified carotid artery: Secondary | ICD-10-CM | POA: Diagnosis not present

## 2014-03-03 DIAGNOSIS — I10 Essential (primary) hypertension: Secondary | ICD-10-CM | POA: Diagnosis not present

## 2014-03-03 DIAGNOSIS — I2510 Atherosclerotic heart disease of native coronary artery without angina pectoris: Secondary | ICD-10-CM | POA: Diagnosis not present

## 2014-03-03 DIAGNOSIS — Z8673 Personal history of transient ischemic attack (TIA), and cerebral infarction without residual deficits: Secondary | ICD-10-CM | POA: Diagnosis not present

## 2014-03-07 DIAGNOSIS — E1129 Type 2 diabetes mellitus with other diabetic kidney complication: Secondary | ICD-10-CM | POA: Diagnosis not present

## 2014-03-07 DIAGNOSIS — E039 Hypothyroidism, unspecified: Secondary | ICD-10-CM | POA: Diagnosis not present

## 2014-03-07 DIAGNOSIS — B372 Candidiasis of skin and nail: Secondary | ICD-10-CM | POA: Diagnosis not present

## 2014-03-07 DIAGNOSIS — I119 Hypertensive heart disease without heart failure: Secondary | ICD-10-CM | POA: Diagnosis not present

## 2014-03-07 DIAGNOSIS — Z0000 Encounter for general adult medical examination without abnormal findings: Secondary | ICD-10-CM | POA: Diagnosis not present

## 2014-03-07 DIAGNOSIS — Z23 Encounter for immunization: Secondary | ICD-10-CM | POA: Diagnosis not present

## 2014-04-04 DIAGNOSIS — M545 Low back pain, unspecified: Secondary | ICD-10-CM | POA: Diagnosis not present

## 2014-05-16 DIAGNOSIS — I2510 Atherosclerotic heart disease of native coronary artery without angina pectoris: Secondary | ICD-10-CM | POA: Diagnosis not present

## 2014-05-16 DIAGNOSIS — E785 Hyperlipidemia, unspecified: Secondary | ICD-10-CM | POA: Diagnosis not present

## 2014-05-16 DIAGNOSIS — I10 Essential (primary) hypertension: Secondary | ICD-10-CM | POA: Diagnosis not present

## 2014-06-06 DIAGNOSIS — I119 Hypertensive heart disease without heart failure: Secondary | ICD-10-CM | POA: Diagnosis not present

## 2014-06-06 DIAGNOSIS — E780 Pure hypercholesterolemia: Secondary | ICD-10-CM | POA: Diagnosis not present

## 2014-06-06 DIAGNOSIS — Z79899 Other long term (current) drug therapy: Secondary | ICD-10-CM | POA: Diagnosis not present

## 2014-06-06 DIAGNOSIS — E039 Hypothyroidism, unspecified: Secondary | ICD-10-CM | POA: Diagnosis not present

## 2014-06-06 DIAGNOSIS — N183 Chronic kidney disease, stage 3 (moderate): Secondary | ICD-10-CM | POA: Diagnosis not present

## 2014-06-06 DIAGNOSIS — Z23 Encounter for immunization: Secondary | ICD-10-CM | POA: Diagnosis not present

## 2014-06-06 DIAGNOSIS — E1121 Type 2 diabetes mellitus with diabetic nephropathy: Secondary | ICD-10-CM | POA: Diagnosis not present

## 2014-07-07 DIAGNOSIS — I82409 Acute embolism and thrombosis of unspecified deep veins of unspecified lower extremity: Secondary | ICD-10-CM

## 2014-07-07 HISTORY — DX: Acute embolism and thrombosis of unspecified deep veins of unspecified lower extremity: I82.409

## 2014-10-09 DIAGNOSIS — E1165 Type 2 diabetes mellitus with hyperglycemia: Secondary | ICD-10-CM | POA: Diagnosis not present

## 2014-10-09 DIAGNOSIS — E1129 Type 2 diabetes mellitus with other diabetic kidney complication: Secondary | ICD-10-CM | POA: Diagnosis not present

## 2014-10-09 DIAGNOSIS — Z1211 Encounter for screening for malignant neoplasm of colon: Secondary | ICD-10-CM | POA: Diagnosis not present

## 2014-10-09 DIAGNOSIS — G629 Polyneuropathy, unspecified: Secondary | ICD-10-CM | POA: Diagnosis not present

## 2014-10-09 DIAGNOSIS — E780 Pure hypercholesterolemia: Secondary | ICD-10-CM | POA: Diagnosis not present

## 2014-10-13 DIAGNOSIS — M1712 Unilateral primary osteoarthritis, left knee: Secondary | ICD-10-CM | POA: Diagnosis not present

## 2014-10-20 DIAGNOSIS — Z1211 Encounter for screening for malignant neoplasm of colon: Secondary | ICD-10-CM | POA: Diagnosis not present

## 2014-10-27 DIAGNOSIS — M25561 Pain in right knee: Secondary | ICD-10-CM | POA: Diagnosis not present

## 2014-10-27 DIAGNOSIS — M1711 Unilateral primary osteoarthritis, right knee: Secondary | ICD-10-CM | POA: Diagnosis not present

## 2014-11-16 DIAGNOSIS — K921 Melena: Secondary | ICD-10-CM | POA: Diagnosis not present

## 2014-11-16 DIAGNOSIS — K591 Functional diarrhea: Secondary | ICD-10-CM | POA: Diagnosis not present

## 2014-11-21 DIAGNOSIS — I2510 Atherosclerotic heart disease of native coronary artery without angina pectoris: Secondary | ICD-10-CM | POA: Diagnosis not present

## 2014-11-21 DIAGNOSIS — E119 Type 2 diabetes mellitus without complications: Secondary | ICD-10-CM | POA: Diagnosis not present

## 2015-01-02 DIAGNOSIS — G629 Polyneuropathy, unspecified: Secondary | ICD-10-CM | POA: Diagnosis not present

## 2015-01-02 DIAGNOSIS — Z23 Encounter for immunization: Secondary | ICD-10-CM | POA: Diagnosis not present

## 2015-01-02 DIAGNOSIS — M159 Polyosteoarthritis, unspecified: Secondary | ICD-10-CM | POA: Diagnosis not present

## 2015-01-02 DIAGNOSIS — N3090 Cystitis, unspecified without hematuria: Secondary | ICD-10-CM | POA: Diagnosis not present

## 2015-01-02 DIAGNOSIS — E1122 Type 2 diabetes mellitus with diabetic chronic kidney disease: Secondary | ICD-10-CM | POA: Diagnosis not present

## 2015-01-17 DIAGNOSIS — L0390 Cellulitis, unspecified: Secondary | ICD-10-CM | POA: Diagnosis not present

## 2015-01-19 DIAGNOSIS — L0390 Cellulitis, unspecified: Secondary | ICD-10-CM | POA: Diagnosis not present

## 2015-01-22 DIAGNOSIS — T8131XA Disruption of external operation (surgical) wound, not elsewhere classified, initial encounter: Secondary | ICD-10-CM | POA: Diagnosis not present

## 2015-01-22 DIAGNOSIS — E1169 Type 2 diabetes mellitus with other specified complication: Secondary | ICD-10-CM | POA: Diagnosis not present

## 2015-01-23 DIAGNOSIS — E1165 Type 2 diabetes mellitus with hyperglycemia: Secondary | ICD-10-CM | POA: Diagnosis not present

## 2015-01-23 DIAGNOSIS — L03116 Cellulitis of left lower limb: Secondary | ICD-10-CM | POA: Diagnosis not present

## 2015-01-23 DIAGNOSIS — R938 Abnormal findings on diagnostic imaging of other specified body structures: Secondary | ICD-10-CM | POA: Diagnosis not present

## 2015-01-24 DIAGNOSIS — M1990 Unspecified osteoarthritis, unspecified site: Secondary | ICD-10-CM | POA: Diagnosis present

## 2015-01-24 DIAGNOSIS — Z9049 Acquired absence of other specified parts of digestive tract: Secondary | ICD-10-CM | POA: Diagnosis present

## 2015-01-24 DIAGNOSIS — I10 Essential (primary) hypertension: Secondary | ICD-10-CM | POA: Diagnosis present

## 2015-01-24 DIAGNOSIS — F329 Major depressive disorder, single episode, unspecified: Secondary | ICD-10-CM | POA: Diagnosis present

## 2015-01-24 DIAGNOSIS — K219 Gastro-esophageal reflux disease without esophagitis: Secondary | ICD-10-CM | POA: Diagnosis present

## 2015-01-24 DIAGNOSIS — E039 Hypothyroidism, unspecified: Secondary | ICD-10-CM | POA: Diagnosis not present

## 2015-01-24 DIAGNOSIS — Z7982 Long term (current) use of aspirin: Secondary | ICD-10-CM | POA: Diagnosis not present

## 2015-01-24 DIAGNOSIS — M25551 Pain in right hip: Secondary | ICD-10-CM | POA: Diagnosis not present

## 2015-01-24 DIAGNOSIS — I252 Old myocardial infarction: Secondary | ICD-10-CM | POA: Diagnosis not present

## 2015-01-24 DIAGNOSIS — M79605 Pain in left leg: Secondary | ICD-10-CM | POA: Diagnosis not present

## 2015-01-24 DIAGNOSIS — E669 Obesity, unspecified: Secondary | ICD-10-CM | POA: Diagnosis present

## 2015-01-24 DIAGNOSIS — N179 Acute kidney failure, unspecified: Secondary | ICD-10-CM | POA: Diagnosis not present

## 2015-01-24 DIAGNOSIS — S79911A Unspecified injury of right hip, initial encounter: Secondary | ICD-10-CM | POA: Diagnosis not present

## 2015-01-24 DIAGNOSIS — E1165 Type 2 diabetes mellitus with hyperglycemia: Secondary | ICD-10-CM | POA: Diagnosis not present

## 2015-01-24 DIAGNOSIS — F419 Anxiety disorder, unspecified: Secondary | ICD-10-CM | POA: Diagnosis present

## 2015-01-24 DIAGNOSIS — Z794 Long term (current) use of insulin: Secondary | ICD-10-CM | POA: Diagnosis not present

## 2015-01-24 DIAGNOSIS — Z91030 Bee allergy status: Secondary | ICD-10-CM | POA: Diagnosis not present

## 2015-01-24 DIAGNOSIS — F0390 Unspecified dementia without behavioral disturbance: Secondary | ICD-10-CM | POA: Diagnosis present

## 2015-01-24 DIAGNOSIS — L03116 Cellulitis of left lower limb: Secondary | ICD-10-CM | POA: Diagnosis not present

## 2015-01-24 DIAGNOSIS — L97129 Non-pressure chronic ulcer of left thigh with unspecified severity: Secondary | ICD-10-CM | POA: Diagnosis not present

## 2015-01-24 DIAGNOSIS — L02416 Cutaneous abscess of left lower limb: Secondary | ICD-10-CM | POA: Diagnosis not present

## 2015-01-24 DIAGNOSIS — R938 Abnormal findings on diagnostic imaging of other specified body structures: Secondary | ICD-10-CM | POA: Diagnosis not present

## 2015-01-24 DIAGNOSIS — Z90710 Acquired absence of both cervix and uterus: Secondary | ICD-10-CM | POA: Diagnosis not present

## 2015-01-24 DIAGNOSIS — Z791 Long term (current) use of non-steroidal anti-inflammatories (NSAID): Secondary | ICD-10-CM | POA: Diagnosis not present

## 2015-01-24 DIAGNOSIS — Z950 Presence of cardiac pacemaker: Secondary | ICD-10-CM | POA: Diagnosis not present

## 2015-01-28 DIAGNOSIS — N179 Acute kidney failure, unspecified: Secondary | ICD-10-CM | POA: Diagnosis not present

## 2015-01-28 DIAGNOSIS — E1165 Type 2 diabetes mellitus with hyperglycemia: Secondary | ICD-10-CM | POA: Diagnosis not present

## 2015-01-28 DIAGNOSIS — M79605 Pain in left leg: Secondary | ICD-10-CM | POA: Diagnosis not present

## 2015-01-28 DIAGNOSIS — L03116 Cellulitis of left lower limb: Secondary | ICD-10-CM | POA: Diagnosis not present

## 2015-01-28 DIAGNOSIS — M159 Polyosteoarthritis, unspecified: Secondary | ICD-10-CM | POA: Diagnosis not present

## 2015-01-28 DIAGNOSIS — T8189XA Other complications of procedures, not elsewhere classified, initial encounter: Secondary | ICD-10-CM | POA: Diagnosis not present

## 2015-01-29 DIAGNOSIS — M79605 Pain in left leg: Secondary | ICD-10-CM | POA: Diagnosis not present

## 2015-01-29 DIAGNOSIS — L03116 Cellulitis of left lower limb: Secondary | ICD-10-CM | POA: Diagnosis not present

## 2015-01-29 DIAGNOSIS — E1165 Type 2 diabetes mellitus with hyperglycemia: Secondary | ICD-10-CM | POA: Diagnosis not present

## 2015-01-29 DIAGNOSIS — T8189XA Other complications of procedures, not elsewhere classified, initial encounter: Secondary | ICD-10-CM | POA: Diagnosis not present

## 2015-01-29 DIAGNOSIS — M159 Polyosteoarthritis, unspecified: Secondary | ICD-10-CM | POA: Diagnosis not present

## 2015-01-29 DIAGNOSIS — N179 Acute kidney failure, unspecified: Secondary | ICD-10-CM | POA: Diagnosis not present

## 2015-01-30 DIAGNOSIS — N179 Acute kidney failure, unspecified: Secondary | ICD-10-CM | POA: Diagnosis not present

## 2015-01-30 DIAGNOSIS — I4519 Other right bundle-branch block: Secondary | ICD-10-CM | POA: Diagnosis not present

## 2015-01-30 DIAGNOSIS — E1165 Type 2 diabetes mellitus with hyperglycemia: Secondary | ICD-10-CM | POA: Diagnosis not present

## 2015-01-30 DIAGNOSIS — Z79899 Other long term (current) drug therapy: Secondary | ICD-10-CM | POA: Diagnosis not present

## 2015-01-30 DIAGNOSIS — I82401 Acute embolism and thrombosis of unspecified deep veins of right lower extremity: Secondary | ICD-10-CM | POA: Diagnosis not present

## 2015-01-30 DIAGNOSIS — R079 Chest pain, unspecified: Secondary | ICD-10-CM | POA: Diagnosis not present

## 2015-01-30 DIAGNOSIS — I5032 Chronic diastolic (congestive) heart failure: Secondary | ICD-10-CM | POA: Diagnosis not present

## 2015-01-30 DIAGNOSIS — R072 Precordial pain: Secondary | ICD-10-CM | POA: Diagnosis not present

## 2015-01-30 DIAGNOSIS — I2510 Atherosclerotic heart disease of native coronary artery without angina pectoris: Secondary | ICD-10-CM | POA: Diagnosis not present

## 2015-01-30 DIAGNOSIS — I252 Old myocardial infarction: Secondary | ICD-10-CM | POA: Diagnosis not present

## 2015-01-30 DIAGNOSIS — I82411 Acute embolism and thrombosis of right femoral vein: Secondary | ICD-10-CM | POA: Diagnosis present

## 2015-01-30 DIAGNOSIS — M1990 Unspecified osteoarthritis, unspecified site: Secondary | ICD-10-CM | POA: Diagnosis not present

## 2015-01-30 DIAGNOSIS — Z6841 Body Mass Index (BMI) 40.0 and over, adult: Secondary | ICD-10-CM | POA: Diagnosis not present

## 2015-01-30 DIAGNOSIS — I25119 Atherosclerotic heart disease of native coronary artery with unspecified angina pectoris: Secondary | ICD-10-CM | POA: Diagnosis present

## 2015-01-30 DIAGNOSIS — F0390 Unspecified dementia without behavioral disturbance: Secondary | ICD-10-CM | POA: Diagnosis not present

## 2015-01-30 DIAGNOSIS — E785 Hyperlipidemia, unspecified: Secondary | ICD-10-CM | POA: Diagnosis present

## 2015-01-30 DIAGNOSIS — N170 Acute kidney failure with tubular necrosis: Secondary | ICD-10-CM | POA: Diagnosis not present

## 2015-01-30 DIAGNOSIS — I10 Essential (primary) hypertension: Secondary | ICD-10-CM | POA: Diagnosis not present

## 2015-01-30 DIAGNOSIS — E039 Hypothyroidism, unspecified: Secondary | ICD-10-CM | POA: Diagnosis present

## 2015-01-30 DIAGNOSIS — J810 Acute pulmonary edema: Secondary | ICD-10-CM | POA: Diagnosis not present

## 2015-01-30 DIAGNOSIS — K219 Gastro-esophageal reflux disease without esophagitis: Secondary | ICD-10-CM | POA: Diagnosis present

## 2015-01-30 DIAGNOSIS — I444 Left anterior fascicular block: Secondary | ICD-10-CM | POA: Diagnosis not present

## 2015-01-30 DIAGNOSIS — M6281 Muscle weakness (generalized): Secondary | ICD-10-CM | POA: Diagnosis not present

## 2015-01-30 DIAGNOSIS — I129 Hypertensive chronic kidney disease with stage 1 through stage 4 chronic kidney disease, or unspecified chronic kidney disease: Secondary | ICD-10-CM | POA: Diagnosis present

## 2015-01-30 DIAGNOSIS — R0781 Pleurodynia: Secondary | ICD-10-CM | POA: Diagnosis not present

## 2015-01-30 DIAGNOSIS — I82409 Acute embolism and thrombosis of unspecified deep veins of unspecified lower extremity: Secondary | ICD-10-CM | POA: Diagnosis not present

## 2015-01-30 DIAGNOSIS — L03116 Cellulitis of left lower limb: Secondary | ICD-10-CM | POA: Diagnosis not present

## 2015-01-30 DIAGNOSIS — N189 Chronic kidney disease, unspecified: Secondary | ICD-10-CM | POA: Diagnosis not present

## 2015-01-30 DIAGNOSIS — Z7982 Long term (current) use of aspirin: Secondary | ICD-10-CM | POA: Diagnosis not present

## 2015-01-30 DIAGNOSIS — Z950 Presence of cardiac pacemaker: Secondary | ICD-10-CM | POA: Diagnosis not present

## 2015-01-30 DIAGNOSIS — K760 Fatty (change of) liver, not elsewhere classified: Secondary | ICD-10-CM | POA: Diagnosis not present

## 2015-01-30 DIAGNOSIS — R55 Syncope and collapse: Secondary | ICD-10-CM | POA: Diagnosis not present

## 2015-01-30 DIAGNOSIS — F419 Anxiety disorder, unspecified: Secondary | ICD-10-CM | POA: Diagnosis not present

## 2015-01-30 DIAGNOSIS — F329 Major depressive disorder, single episode, unspecified: Secondary | ICD-10-CM | POA: Diagnosis not present

## 2015-01-30 DIAGNOSIS — I69998 Other sequelae following unspecified cerebrovascular disease: Secondary | ICD-10-CM | POA: Diagnosis not present

## 2015-01-30 DIAGNOSIS — I2699 Other pulmonary embolism without acute cor pulmonale: Secondary | ICD-10-CM | POA: Diagnosis not present

## 2015-01-30 DIAGNOSIS — R0602 Shortness of breath: Secondary | ICD-10-CM | POA: Diagnosis not present

## 2015-02-04 DIAGNOSIS — I2510 Atherosclerotic heart disease of native coronary artery without angina pectoris: Secondary | ICD-10-CM | POA: Diagnosis not present

## 2015-02-04 DIAGNOSIS — K219 Gastro-esophageal reflux disease without esophagitis: Secondary | ICD-10-CM | POA: Diagnosis not present

## 2015-02-04 DIAGNOSIS — Z79899 Other long term (current) drug therapy: Secondary | ICD-10-CM | POA: Diagnosis not present

## 2015-02-04 DIAGNOSIS — J810 Acute pulmonary edema: Secondary | ICD-10-CM | POA: Diagnosis not present

## 2015-02-04 DIAGNOSIS — I2699 Other pulmonary embolism without acute cor pulmonale: Secondary | ICD-10-CM | POA: Diagnosis not present

## 2015-02-04 DIAGNOSIS — Z6841 Body Mass Index (BMI) 40.0 and over, adult: Secondary | ICD-10-CM | POA: Diagnosis not present

## 2015-02-04 DIAGNOSIS — E785 Hyperlipidemia, unspecified: Secondary | ICD-10-CM | POA: Diagnosis not present

## 2015-02-04 DIAGNOSIS — I5032 Chronic diastolic (congestive) heart failure: Secondary | ICD-10-CM | POA: Diagnosis not present

## 2015-02-04 DIAGNOSIS — I69998 Other sequelae following unspecified cerebrovascular disease: Secondary | ICD-10-CM | POA: Diagnosis not present

## 2015-02-04 DIAGNOSIS — R55 Syncope and collapse: Secondary | ICD-10-CM | POA: Diagnosis not present

## 2015-02-04 DIAGNOSIS — F0390 Unspecified dementia without behavioral disturbance: Secondary | ICD-10-CM | POA: Diagnosis not present

## 2015-02-04 DIAGNOSIS — I252 Old myocardial infarction: Secondary | ICD-10-CM | POA: Diagnosis not present

## 2015-02-04 DIAGNOSIS — F329 Major depressive disorder, single episode, unspecified: Secondary | ICD-10-CM | POA: Diagnosis not present

## 2015-02-04 DIAGNOSIS — I10 Essential (primary) hypertension: Secondary | ICD-10-CM | POA: Diagnosis not present

## 2015-02-04 DIAGNOSIS — M1990 Unspecified osteoarthritis, unspecified site: Secondary | ICD-10-CM | POA: Diagnosis not present

## 2015-02-04 DIAGNOSIS — M6281 Muscle weakness (generalized): Secondary | ICD-10-CM | POA: Diagnosis not present

## 2015-02-04 DIAGNOSIS — N170 Acute kidney failure with tubular necrosis: Secondary | ICD-10-CM | POA: Diagnosis not present

## 2015-02-04 DIAGNOSIS — E039 Hypothyroidism, unspecified: Secondary | ICD-10-CM | POA: Diagnosis not present

## 2015-02-04 DIAGNOSIS — N179 Acute kidney failure, unspecified: Secondary | ICD-10-CM | POA: Diagnosis not present

## 2015-02-04 DIAGNOSIS — F419 Anxiety disorder, unspecified: Secondary | ICD-10-CM | POA: Diagnosis not present

## 2015-02-04 DIAGNOSIS — Z950 Presence of cardiac pacemaker: Secondary | ICD-10-CM | POA: Diagnosis not present

## 2015-02-04 DIAGNOSIS — I82409 Acute embolism and thrombosis of unspecified deep veins of unspecified lower extremity: Secondary | ICD-10-CM | POA: Diagnosis not present

## 2015-02-04 DIAGNOSIS — R079 Chest pain, unspecified: Secondary | ICD-10-CM | POA: Diagnosis not present

## 2015-02-04 DIAGNOSIS — Z7901 Long term (current) use of anticoagulants: Secondary | ICD-10-CM | POA: Diagnosis not present

## 2015-02-08 DIAGNOSIS — E039 Hypothyroidism, unspecified: Secondary | ICD-10-CM | POA: Diagnosis not present

## 2015-02-08 DIAGNOSIS — E785 Hyperlipidemia, unspecified: Secondary | ICD-10-CM | POA: Diagnosis not present

## 2015-02-08 DIAGNOSIS — Z7901 Long term (current) use of anticoagulants: Secondary | ICD-10-CM | POA: Diagnosis not present

## 2015-02-08 DIAGNOSIS — Z79899 Other long term (current) drug therapy: Secondary | ICD-10-CM | POA: Diagnosis not present

## 2015-02-08 DIAGNOSIS — I10 Essential (primary) hypertension: Secondary | ICD-10-CM | POA: Diagnosis not present

## 2015-02-11 DIAGNOSIS — Z7901 Long term (current) use of anticoagulants: Secondary | ICD-10-CM | POA: Diagnosis not present

## 2015-02-11 DIAGNOSIS — T8189XA Other complications of procedures, not elsewhere classified, initial encounter: Secondary | ICD-10-CM | POA: Diagnosis not present

## 2015-02-11 DIAGNOSIS — E1165 Type 2 diabetes mellitus with hyperglycemia: Secondary | ICD-10-CM | POA: Diagnosis not present

## 2015-02-11 DIAGNOSIS — N179 Acute kidney failure, unspecified: Secondary | ICD-10-CM | POA: Diagnosis not present

## 2015-02-11 DIAGNOSIS — M159 Polyosteoarthritis, unspecified: Secondary | ICD-10-CM | POA: Diagnosis not present

## 2015-02-11 DIAGNOSIS — M79605 Pain in left leg: Secondary | ICD-10-CM | POA: Diagnosis not present

## 2015-02-11 DIAGNOSIS — L03116 Cellulitis of left lower limb: Secondary | ICD-10-CM | POA: Diagnosis not present

## 2015-02-12 DIAGNOSIS — M159 Polyosteoarthritis, unspecified: Secondary | ICD-10-CM | POA: Diagnosis not present

## 2015-02-12 DIAGNOSIS — T8189XA Other complications of procedures, not elsewhere classified, initial encounter: Secondary | ICD-10-CM | POA: Diagnosis not present

## 2015-02-12 DIAGNOSIS — L03116 Cellulitis of left lower limb: Secondary | ICD-10-CM | POA: Diagnosis not present

## 2015-02-12 DIAGNOSIS — E1165 Type 2 diabetes mellitus with hyperglycemia: Secondary | ICD-10-CM | POA: Diagnosis not present

## 2015-02-12 DIAGNOSIS — M79605 Pain in left leg: Secondary | ICD-10-CM | POA: Diagnosis not present

## 2015-02-12 DIAGNOSIS — N179 Acute kidney failure, unspecified: Secondary | ICD-10-CM | POA: Diagnosis not present

## 2015-02-13 DIAGNOSIS — M79605 Pain in left leg: Secondary | ICD-10-CM | POA: Diagnosis not present

## 2015-02-13 DIAGNOSIS — L03116 Cellulitis of left lower limb: Secondary | ICD-10-CM | POA: Diagnosis not present

## 2015-02-13 DIAGNOSIS — M159 Polyosteoarthritis, unspecified: Secondary | ICD-10-CM | POA: Diagnosis not present

## 2015-02-13 DIAGNOSIS — I119 Hypertensive heart disease without heart failure: Secondary | ICD-10-CM | POA: Diagnosis not present

## 2015-02-13 DIAGNOSIS — I2692 Saddle embolus of pulmonary artery without acute cor pulmonale: Secondary | ICD-10-CM | POA: Diagnosis not present

## 2015-02-13 DIAGNOSIS — Z7901 Long term (current) use of anticoagulants: Secondary | ICD-10-CM | POA: Diagnosis not present

## 2015-02-13 DIAGNOSIS — Z79899 Other long term (current) drug therapy: Secondary | ICD-10-CM | POA: Diagnosis not present

## 2015-02-13 DIAGNOSIS — T8189XA Other complications of procedures, not elsewhere classified, initial encounter: Secondary | ICD-10-CM | POA: Diagnosis not present

## 2015-02-13 DIAGNOSIS — E1165 Type 2 diabetes mellitus with hyperglycemia: Secondary | ICD-10-CM | POA: Diagnosis not present

## 2015-02-13 DIAGNOSIS — N179 Acute kidney failure, unspecified: Secondary | ICD-10-CM | POA: Diagnosis not present

## 2015-02-13 DIAGNOSIS — N183 Chronic kidney disease, stage 3 (moderate): Secondary | ICD-10-CM | POA: Diagnosis not present

## 2015-02-14 DIAGNOSIS — L03116 Cellulitis of left lower limb: Secondary | ICD-10-CM | POA: Diagnosis not present

## 2015-02-14 DIAGNOSIS — E1165 Type 2 diabetes mellitus with hyperglycemia: Secondary | ICD-10-CM | POA: Diagnosis not present

## 2015-02-14 DIAGNOSIS — M79605 Pain in left leg: Secondary | ICD-10-CM | POA: Diagnosis not present

## 2015-02-14 DIAGNOSIS — T8189XA Other complications of procedures, not elsewhere classified, initial encounter: Secondary | ICD-10-CM | POA: Diagnosis not present

## 2015-02-14 DIAGNOSIS — M159 Polyosteoarthritis, unspecified: Secondary | ICD-10-CM | POA: Diagnosis not present

## 2015-02-14 DIAGNOSIS — N179 Acute kidney failure, unspecified: Secondary | ICD-10-CM | POA: Diagnosis not present

## 2015-02-15 DIAGNOSIS — M79605 Pain in left leg: Secondary | ICD-10-CM | POA: Diagnosis not present

## 2015-02-15 DIAGNOSIS — M159 Polyosteoarthritis, unspecified: Secondary | ICD-10-CM | POA: Diagnosis not present

## 2015-02-15 DIAGNOSIS — E1165 Type 2 diabetes mellitus with hyperglycemia: Secondary | ICD-10-CM | POA: Diagnosis not present

## 2015-02-15 DIAGNOSIS — N179 Acute kidney failure, unspecified: Secondary | ICD-10-CM | POA: Diagnosis not present

## 2015-02-15 DIAGNOSIS — T8189XA Other complications of procedures, not elsewhere classified, initial encounter: Secondary | ICD-10-CM | POA: Diagnosis not present

## 2015-02-15 DIAGNOSIS — L03116 Cellulitis of left lower limb: Secondary | ICD-10-CM | POA: Diagnosis not present

## 2015-02-16 DIAGNOSIS — M79605 Pain in left leg: Secondary | ICD-10-CM | POA: Diagnosis not present

## 2015-02-16 DIAGNOSIS — M159 Polyosteoarthritis, unspecified: Secondary | ICD-10-CM | POA: Diagnosis not present

## 2015-02-16 DIAGNOSIS — N179 Acute kidney failure, unspecified: Secondary | ICD-10-CM | POA: Diagnosis not present

## 2015-02-16 DIAGNOSIS — T8189XA Other complications of procedures, not elsewhere classified, initial encounter: Secondary | ICD-10-CM | POA: Diagnosis not present

## 2015-02-16 DIAGNOSIS — E1165 Type 2 diabetes mellitus with hyperglycemia: Secondary | ICD-10-CM | POA: Diagnosis not present

## 2015-02-16 DIAGNOSIS — Z7901 Long term (current) use of anticoagulants: Secondary | ICD-10-CM | POA: Diagnosis not present

## 2015-02-16 DIAGNOSIS — L03116 Cellulitis of left lower limb: Secondary | ICD-10-CM | POA: Diagnosis not present

## 2015-02-16 DIAGNOSIS — R7309 Other abnormal glucose: Secondary | ICD-10-CM | POA: Diagnosis not present

## 2015-02-17 DIAGNOSIS — N179 Acute kidney failure, unspecified: Secondary | ICD-10-CM | POA: Diagnosis not present

## 2015-02-17 DIAGNOSIS — M79605 Pain in left leg: Secondary | ICD-10-CM | POA: Diagnosis not present

## 2015-02-17 DIAGNOSIS — L03116 Cellulitis of left lower limb: Secondary | ICD-10-CM | POA: Diagnosis not present

## 2015-02-17 DIAGNOSIS — T8189XA Other complications of procedures, not elsewhere classified, initial encounter: Secondary | ICD-10-CM | POA: Diagnosis not present

## 2015-02-17 DIAGNOSIS — M159 Polyosteoarthritis, unspecified: Secondary | ICD-10-CM | POA: Diagnosis not present

## 2015-02-17 DIAGNOSIS — E1165 Type 2 diabetes mellitus with hyperglycemia: Secondary | ICD-10-CM | POA: Diagnosis not present

## 2015-02-18 DIAGNOSIS — M159 Polyosteoarthritis, unspecified: Secondary | ICD-10-CM | POA: Diagnosis not present

## 2015-02-18 DIAGNOSIS — E1165 Type 2 diabetes mellitus with hyperglycemia: Secondary | ICD-10-CM | POA: Diagnosis not present

## 2015-02-18 DIAGNOSIS — T8189XA Other complications of procedures, not elsewhere classified, initial encounter: Secondary | ICD-10-CM | POA: Diagnosis not present

## 2015-02-18 DIAGNOSIS — N179 Acute kidney failure, unspecified: Secondary | ICD-10-CM | POA: Diagnosis not present

## 2015-02-18 DIAGNOSIS — M79605 Pain in left leg: Secondary | ICD-10-CM | POA: Diagnosis not present

## 2015-02-18 DIAGNOSIS — L03116 Cellulitis of left lower limb: Secondary | ICD-10-CM | POA: Diagnosis not present

## 2015-02-19 DIAGNOSIS — E1165 Type 2 diabetes mellitus with hyperglycemia: Secondary | ICD-10-CM | POA: Diagnosis not present

## 2015-02-19 DIAGNOSIS — N179 Acute kidney failure, unspecified: Secondary | ICD-10-CM | POA: Diagnosis not present

## 2015-02-19 DIAGNOSIS — T8189XA Other complications of procedures, not elsewhere classified, initial encounter: Secondary | ICD-10-CM | POA: Diagnosis not present

## 2015-02-19 DIAGNOSIS — M79605 Pain in left leg: Secondary | ICD-10-CM | POA: Diagnosis not present

## 2015-02-19 DIAGNOSIS — M159 Polyosteoarthritis, unspecified: Secondary | ICD-10-CM | POA: Diagnosis not present

## 2015-02-19 DIAGNOSIS — L03116 Cellulitis of left lower limb: Secondary | ICD-10-CM | POA: Diagnosis not present

## 2015-02-20 DIAGNOSIS — E1165 Type 2 diabetes mellitus with hyperglycemia: Secondary | ICD-10-CM | POA: Diagnosis not present

## 2015-02-20 DIAGNOSIS — T8189XA Other complications of procedures, not elsewhere classified, initial encounter: Secondary | ICD-10-CM | POA: Diagnosis not present

## 2015-02-20 DIAGNOSIS — L03116 Cellulitis of left lower limb: Secondary | ICD-10-CM | POA: Diagnosis not present

## 2015-02-20 DIAGNOSIS — N179 Acute kidney failure, unspecified: Secondary | ICD-10-CM | POA: Diagnosis not present

## 2015-02-20 DIAGNOSIS — M159 Polyosteoarthritis, unspecified: Secondary | ICD-10-CM | POA: Diagnosis not present

## 2015-02-20 DIAGNOSIS — M79605 Pain in left leg: Secondary | ICD-10-CM | POA: Diagnosis not present

## 2015-02-21 DIAGNOSIS — N179 Acute kidney failure, unspecified: Secondary | ICD-10-CM | POA: Diagnosis not present

## 2015-02-21 DIAGNOSIS — T8189XA Other complications of procedures, not elsewhere classified, initial encounter: Secondary | ICD-10-CM | POA: Diagnosis not present

## 2015-02-21 DIAGNOSIS — E1165 Type 2 diabetes mellitus with hyperglycemia: Secondary | ICD-10-CM | POA: Diagnosis not present

## 2015-02-21 DIAGNOSIS — M159 Polyosteoarthritis, unspecified: Secondary | ICD-10-CM | POA: Diagnosis not present

## 2015-02-21 DIAGNOSIS — M79605 Pain in left leg: Secondary | ICD-10-CM | POA: Diagnosis not present

## 2015-02-21 DIAGNOSIS — L03116 Cellulitis of left lower limb: Secondary | ICD-10-CM | POA: Diagnosis not present

## 2015-02-22 DIAGNOSIS — E1165 Type 2 diabetes mellitus with hyperglycemia: Secondary | ICD-10-CM | POA: Diagnosis not present

## 2015-02-22 DIAGNOSIS — M159 Polyosteoarthritis, unspecified: Secondary | ICD-10-CM | POA: Diagnosis not present

## 2015-02-22 DIAGNOSIS — M79605 Pain in left leg: Secondary | ICD-10-CM | POA: Diagnosis not present

## 2015-02-22 DIAGNOSIS — T8189XA Other complications of procedures, not elsewhere classified, initial encounter: Secondary | ICD-10-CM | POA: Diagnosis not present

## 2015-02-22 DIAGNOSIS — L03116 Cellulitis of left lower limb: Secondary | ICD-10-CM | POA: Diagnosis not present

## 2015-02-22 DIAGNOSIS — N179 Acute kidney failure, unspecified: Secondary | ICD-10-CM | POA: Diagnosis not present

## 2015-02-23 DIAGNOSIS — Z7901 Long term (current) use of anticoagulants: Secondary | ICD-10-CM | POA: Diagnosis not present

## 2015-02-23 DIAGNOSIS — M79605 Pain in left leg: Secondary | ICD-10-CM | POA: Diagnosis not present

## 2015-02-23 DIAGNOSIS — T8189XA Other complications of procedures, not elsewhere classified, initial encounter: Secondary | ICD-10-CM | POA: Diagnosis not present

## 2015-02-23 DIAGNOSIS — N179 Acute kidney failure, unspecified: Secondary | ICD-10-CM | POA: Diagnosis not present

## 2015-02-23 DIAGNOSIS — M159 Polyosteoarthritis, unspecified: Secondary | ICD-10-CM | POA: Diagnosis not present

## 2015-02-23 DIAGNOSIS — E1165 Type 2 diabetes mellitus with hyperglycemia: Secondary | ICD-10-CM | POA: Diagnosis not present

## 2015-02-23 DIAGNOSIS — L03116 Cellulitis of left lower limb: Secondary | ICD-10-CM | POA: Diagnosis not present

## 2015-02-26 DIAGNOSIS — M159 Polyosteoarthritis, unspecified: Secondary | ICD-10-CM | POA: Diagnosis not present

## 2015-02-26 DIAGNOSIS — L03116 Cellulitis of left lower limb: Secondary | ICD-10-CM | POA: Diagnosis not present

## 2015-02-26 DIAGNOSIS — T8189XA Other complications of procedures, not elsewhere classified, initial encounter: Secondary | ICD-10-CM | POA: Diagnosis not present

## 2015-02-26 DIAGNOSIS — M79605 Pain in left leg: Secondary | ICD-10-CM | POA: Diagnosis not present

## 2015-02-26 DIAGNOSIS — E1165 Type 2 diabetes mellitus with hyperglycemia: Secondary | ICD-10-CM | POA: Diagnosis not present

## 2015-02-26 DIAGNOSIS — Z7901 Long term (current) use of anticoagulants: Secondary | ICD-10-CM | POA: Diagnosis not present

## 2015-02-26 DIAGNOSIS — N179 Acute kidney failure, unspecified: Secondary | ICD-10-CM | POA: Diagnosis not present

## 2015-02-28 DIAGNOSIS — L03116 Cellulitis of left lower limb: Secondary | ICD-10-CM | POA: Diagnosis not present

## 2015-02-28 DIAGNOSIS — M159 Polyosteoarthritis, unspecified: Secondary | ICD-10-CM | POA: Diagnosis not present

## 2015-02-28 DIAGNOSIS — N179 Acute kidney failure, unspecified: Secondary | ICD-10-CM | POA: Diagnosis not present

## 2015-02-28 DIAGNOSIS — E1165 Type 2 diabetes mellitus with hyperglycemia: Secondary | ICD-10-CM | POA: Diagnosis not present

## 2015-02-28 DIAGNOSIS — M79605 Pain in left leg: Secondary | ICD-10-CM | POA: Diagnosis not present

## 2015-02-28 DIAGNOSIS — T8189XA Other complications of procedures, not elsewhere classified, initial encounter: Secondary | ICD-10-CM | POA: Diagnosis not present

## 2015-03-02 DIAGNOSIS — T8189XA Other complications of procedures, not elsewhere classified, initial encounter: Secondary | ICD-10-CM | POA: Diagnosis not present

## 2015-03-02 DIAGNOSIS — M159 Polyosteoarthritis, unspecified: Secondary | ICD-10-CM | POA: Diagnosis not present

## 2015-03-02 DIAGNOSIS — E1165 Type 2 diabetes mellitus with hyperglycemia: Secondary | ICD-10-CM | POA: Diagnosis not present

## 2015-03-02 DIAGNOSIS — R0600 Dyspnea, unspecified: Secondary | ICD-10-CM | POA: Diagnosis not present

## 2015-03-02 DIAGNOSIS — Z7901 Long term (current) use of anticoagulants: Secondary | ICD-10-CM | POA: Diagnosis not present

## 2015-03-02 DIAGNOSIS — N179 Acute kidney failure, unspecified: Secondary | ICD-10-CM | POA: Diagnosis not present

## 2015-03-02 DIAGNOSIS — Z794 Long term (current) use of insulin: Secondary | ICD-10-CM | POA: Diagnosis not present

## 2015-03-02 DIAGNOSIS — E1122 Type 2 diabetes mellitus with diabetic chronic kidney disease: Secondary | ICD-10-CM | POA: Diagnosis not present

## 2015-03-02 DIAGNOSIS — M79605 Pain in left leg: Secondary | ICD-10-CM | POA: Diagnosis not present

## 2015-03-02 DIAGNOSIS — L03116 Cellulitis of left lower limb: Secondary | ICD-10-CM | POA: Diagnosis not present

## 2015-03-02 DIAGNOSIS — N183 Chronic kidney disease, stage 3 (moderate): Secondary | ICD-10-CM | POA: Diagnosis not present

## 2015-03-05 DIAGNOSIS — L03116 Cellulitis of left lower limb: Secondary | ICD-10-CM | POA: Diagnosis not present

## 2015-03-05 DIAGNOSIS — T8189XA Other complications of procedures, not elsewhere classified, initial encounter: Secondary | ICD-10-CM | POA: Diagnosis not present

## 2015-03-05 DIAGNOSIS — M159 Polyosteoarthritis, unspecified: Secondary | ICD-10-CM | POA: Diagnosis not present

## 2015-03-05 DIAGNOSIS — E1165 Type 2 diabetes mellitus with hyperglycemia: Secondary | ICD-10-CM | POA: Diagnosis not present

## 2015-03-05 DIAGNOSIS — N179 Acute kidney failure, unspecified: Secondary | ICD-10-CM | POA: Diagnosis not present

## 2015-03-05 DIAGNOSIS — M79605 Pain in left leg: Secondary | ICD-10-CM | POA: Diagnosis not present

## 2015-03-06 DIAGNOSIS — Z7901 Long term (current) use of anticoagulants: Secondary | ICD-10-CM | POA: Diagnosis not present

## 2015-03-07 DIAGNOSIS — T8189XA Other complications of procedures, not elsewhere classified, initial encounter: Secondary | ICD-10-CM | POA: Diagnosis not present

## 2015-03-07 DIAGNOSIS — M159 Polyosteoarthritis, unspecified: Secondary | ICD-10-CM | POA: Diagnosis not present

## 2015-03-07 DIAGNOSIS — E1165 Type 2 diabetes mellitus with hyperglycemia: Secondary | ICD-10-CM | POA: Diagnosis not present

## 2015-03-07 DIAGNOSIS — L03116 Cellulitis of left lower limb: Secondary | ICD-10-CM | POA: Diagnosis not present

## 2015-03-07 DIAGNOSIS — N179 Acute kidney failure, unspecified: Secondary | ICD-10-CM | POA: Diagnosis not present

## 2015-03-07 DIAGNOSIS — M79605 Pain in left leg: Secondary | ICD-10-CM | POA: Diagnosis not present

## 2015-03-08 DIAGNOSIS — L0291 Cutaneous abscess, unspecified: Secondary | ICD-10-CM | POA: Diagnosis not present

## 2015-03-08 DIAGNOSIS — E1069 Type 1 diabetes mellitus with other specified complication: Secondary | ICD-10-CM | POA: Diagnosis not present

## 2015-03-08 DIAGNOSIS — E669 Obesity, unspecified: Secondary | ICD-10-CM | POA: Diagnosis not present

## 2015-03-12 DIAGNOSIS — M79605 Pain in left leg: Secondary | ICD-10-CM | POA: Diagnosis not present

## 2015-03-12 DIAGNOSIS — N179 Acute kidney failure, unspecified: Secondary | ICD-10-CM | POA: Diagnosis not present

## 2015-03-12 DIAGNOSIS — T8189XA Other complications of procedures, not elsewhere classified, initial encounter: Secondary | ICD-10-CM | POA: Diagnosis not present

## 2015-03-12 DIAGNOSIS — E1165 Type 2 diabetes mellitus with hyperglycemia: Secondary | ICD-10-CM | POA: Diagnosis not present

## 2015-03-12 DIAGNOSIS — M159 Polyosteoarthritis, unspecified: Secondary | ICD-10-CM | POA: Diagnosis not present

## 2015-03-12 DIAGNOSIS — L03116 Cellulitis of left lower limb: Secondary | ICD-10-CM | POA: Diagnosis not present

## 2015-03-13 DIAGNOSIS — E1165 Type 2 diabetes mellitus with hyperglycemia: Secondary | ICD-10-CM | POA: Diagnosis not present

## 2015-03-13 DIAGNOSIS — E1122 Type 2 diabetes mellitus with diabetic chronic kidney disease: Secondary | ICD-10-CM | POA: Diagnosis not present

## 2015-03-13 DIAGNOSIS — Z7901 Long term (current) use of anticoagulants: Secondary | ICD-10-CM | POA: Diagnosis not present

## 2015-03-13 DIAGNOSIS — Z79899 Other long term (current) drug therapy: Secondary | ICD-10-CM | POA: Diagnosis not present

## 2015-03-13 DIAGNOSIS — Z794 Long term (current) use of insulin: Secondary | ICD-10-CM | POA: Diagnosis not present

## 2015-03-13 DIAGNOSIS — R7309 Other abnormal glucose: Secondary | ICD-10-CM | POA: Diagnosis not present

## 2015-03-13 DIAGNOSIS — Z23 Encounter for immunization: Secondary | ICD-10-CM | POA: Diagnosis not present

## 2015-03-13 DIAGNOSIS — I2692 Saddle embolus of pulmonary artery without acute cor pulmonale: Secondary | ICD-10-CM | POA: Diagnosis not present

## 2015-03-13 DIAGNOSIS — N183 Chronic kidney disease, stage 3 (moderate): Secondary | ICD-10-CM | POA: Diagnosis not present

## 2015-03-13 DIAGNOSIS — E780 Pure hypercholesterolemia: Secondary | ICD-10-CM | POA: Diagnosis not present

## 2015-03-13 DIAGNOSIS — Z0000 Encounter for general adult medical examination without abnormal findings: Secondary | ICD-10-CM | POA: Diagnosis not present

## 2015-03-20 DIAGNOSIS — Z7901 Long term (current) use of anticoagulants: Secondary | ICD-10-CM | POA: Diagnosis not present

## 2015-03-27 DIAGNOSIS — Z7901 Long term (current) use of anticoagulants: Secondary | ICD-10-CM | POA: Diagnosis not present

## 2015-04-03 DIAGNOSIS — Z7901 Long term (current) use of anticoagulants: Secondary | ICD-10-CM | POA: Diagnosis not present

## 2015-04-17 DIAGNOSIS — Z7901 Long term (current) use of anticoagulants: Secondary | ICD-10-CM | POA: Diagnosis not present

## 2015-05-01 DIAGNOSIS — Z7901 Long term (current) use of anticoagulants: Secondary | ICD-10-CM | POA: Diagnosis not present

## 2015-05-02 DIAGNOSIS — M1712 Unilateral primary osteoarthritis, left knee: Secondary | ICD-10-CM | POA: Diagnosis not present

## 2015-05-09 DIAGNOSIS — I252 Old myocardial infarction: Secondary | ICD-10-CM | POA: Diagnosis not present

## 2015-05-09 DIAGNOSIS — I509 Heart failure, unspecified: Secondary | ICD-10-CM | POA: Diagnosis not present

## 2015-05-09 DIAGNOSIS — Z950 Presence of cardiac pacemaker: Secondary | ICD-10-CM | POA: Diagnosis not present

## 2015-05-09 DIAGNOSIS — R0789 Other chest pain: Secondary | ICD-10-CM | POA: Diagnosis not present

## 2015-05-15 DIAGNOSIS — Z7901 Long term (current) use of anticoagulants: Secondary | ICD-10-CM | POA: Diagnosis not present

## 2015-05-16 DIAGNOSIS — M1711 Unilateral primary osteoarthritis, right knee: Secondary | ICD-10-CM | POA: Diagnosis not present

## 2015-05-16 DIAGNOSIS — M25561 Pain in right knee: Secondary | ICD-10-CM | POA: Diagnosis not present

## 2015-05-23 DIAGNOSIS — M1711 Unilateral primary osteoarthritis, right knee: Secondary | ICD-10-CM | POA: Diagnosis not present

## 2015-05-25 DIAGNOSIS — M1712 Unilateral primary osteoarthritis, left knee: Secondary | ICD-10-CM | POA: Diagnosis not present

## 2015-05-29 DIAGNOSIS — Z7901 Long term (current) use of anticoagulants: Secondary | ICD-10-CM | POA: Diagnosis not present

## 2015-06-13 DIAGNOSIS — M25561 Pain in right knee: Secondary | ICD-10-CM | POA: Diagnosis not present

## 2015-06-13 DIAGNOSIS — M25562 Pain in left knee: Secondary | ICD-10-CM | POA: Diagnosis not present

## 2015-06-15 DIAGNOSIS — M25561 Pain in right knee: Secondary | ICD-10-CM | POA: Diagnosis not present

## 2015-06-15 DIAGNOSIS — M25562 Pain in left knee: Secondary | ICD-10-CM | POA: Diagnosis not present

## 2015-06-25 DIAGNOSIS — Z7901 Long term (current) use of anticoagulants: Secondary | ICD-10-CM | POA: Diagnosis not present

## 2015-07-13 DIAGNOSIS — M1712 Unilateral primary osteoarthritis, left knee: Secondary | ICD-10-CM | POA: Diagnosis not present

## 2015-07-13 DIAGNOSIS — Z7901 Long term (current) use of anticoagulants: Secondary | ICD-10-CM | POA: Diagnosis not present

## 2015-07-13 DIAGNOSIS — M1711 Unilateral primary osteoarthritis, right knee: Secondary | ICD-10-CM | POA: Diagnosis not present

## 2015-07-18 DIAGNOSIS — L03116 Cellulitis of left lower limb: Secondary | ICD-10-CM | POA: Diagnosis not present

## 2015-07-18 DIAGNOSIS — S81802A Unspecified open wound, left lower leg, initial encounter: Secondary | ICD-10-CM | POA: Diagnosis not present

## 2015-07-18 DIAGNOSIS — L0291 Cutaneous abscess, unspecified: Secondary | ICD-10-CM | POA: Diagnosis not present

## 2015-07-18 DIAGNOSIS — L03115 Cellulitis of right lower limb: Secondary | ICD-10-CM | POA: Diagnosis not present

## 2015-07-18 DIAGNOSIS — L02416 Cutaneous abscess of left lower limb: Secondary | ICD-10-CM | POA: Diagnosis not present

## 2015-07-20 DIAGNOSIS — Z7901 Long term (current) use of anticoagulants: Secondary | ICD-10-CM | POA: Diagnosis not present

## 2015-07-26 DIAGNOSIS — I2692 Saddle embolus of pulmonary artery without acute cor pulmonale: Secondary | ICD-10-CM | POA: Diagnosis not present

## 2015-07-26 DIAGNOSIS — L0390 Cellulitis, unspecified: Secondary | ICD-10-CM | POA: Diagnosis not present

## 2015-07-26 DIAGNOSIS — B9562 Methicillin resistant Staphylococcus aureus infection as the cause of diseases classified elsewhere: Secondary | ICD-10-CM | POA: Diagnosis not present

## 2015-07-27 DIAGNOSIS — M1711 Unilateral primary osteoarthritis, right knee: Secondary | ICD-10-CM | POA: Diagnosis not present

## 2015-07-27 DIAGNOSIS — M1712 Unilateral primary osteoarthritis, left knee: Secondary | ICD-10-CM | POA: Diagnosis not present

## 2015-08-24 DIAGNOSIS — M1712 Unilateral primary osteoarthritis, left knee: Secondary | ICD-10-CM | POA: Diagnosis not present

## 2015-08-24 DIAGNOSIS — Z7901 Long term (current) use of anticoagulants: Secondary | ICD-10-CM | POA: Diagnosis not present

## 2015-09-21 DIAGNOSIS — Z7901 Long term (current) use of anticoagulants: Secondary | ICD-10-CM | POA: Diagnosis not present

## 2015-09-21 DIAGNOSIS — M1712 Unilateral primary osteoarthritis, left knee: Secondary | ICD-10-CM | POA: Diagnosis not present

## 2015-09-21 DIAGNOSIS — M1711 Unilateral primary osteoarthritis, right knee: Secondary | ICD-10-CM | POA: Diagnosis not present

## 2015-10-03 DIAGNOSIS — E1122 Type 2 diabetes mellitus with diabetic chronic kidney disease: Secondary | ICD-10-CM | POA: Diagnosis not present

## 2015-10-03 DIAGNOSIS — I119 Hypertensive heart disease without heart failure: Secondary | ICD-10-CM | POA: Diagnosis not present

## 2015-10-03 DIAGNOSIS — N183 Chronic kidney disease, stage 3 (moderate): Secondary | ICD-10-CM | POA: Diagnosis not present

## 2015-10-03 DIAGNOSIS — I2692 Saddle embolus of pulmonary artery without acute cor pulmonale: Secondary | ICD-10-CM | POA: Diagnosis not present

## 2015-10-24 DIAGNOSIS — Z7901 Long term (current) use of anticoagulants: Secondary | ICD-10-CM | POA: Diagnosis not present

## 2015-11-21 DIAGNOSIS — Z7901 Long term (current) use of anticoagulants: Secondary | ICD-10-CM | POA: Diagnosis not present

## 2015-11-21 DIAGNOSIS — M1712 Unilateral primary osteoarthritis, left knee: Secondary | ICD-10-CM | POA: Diagnosis not present

## 2015-11-21 DIAGNOSIS — M1711 Unilateral primary osteoarthritis, right knee: Secondary | ICD-10-CM | POA: Diagnosis not present

## 2015-11-28 DIAGNOSIS — Z7901 Long term (current) use of anticoagulants: Secondary | ICD-10-CM | POA: Diagnosis not present

## 2015-12-31 DIAGNOSIS — Z7901 Long term (current) use of anticoagulants: Secondary | ICD-10-CM | POA: Diagnosis not present

## 2016-02-01 DIAGNOSIS — Z7901 Long term (current) use of anticoagulants: Secondary | ICD-10-CM | POA: Diagnosis not present

## 2016-03-06 DIAGNOSIS — Z7901 Long term (current) use of anticoagulants: Secondary | ICD-10-CM | POA: Diagnosis not present

## 2016-03-13 DIAGNOSIS — Z7901 Long term (current) use of anticoagulants: Secondary | ICD-10-CM | POA: Diagnosis not present

## 2016-03-17 DIAGNOSIS — Z7901 Long term (current) use of anticoagulants: Secondary | ICD-10-CM | POA: Diagnosis not present

## 2016-03-31 DIAGNOSIS — Z7901 Long term (current) use of anticoagulants: Secondary | ICD-10-CM | POA: Diagnosis not present

## 2016-04-03 DIAGNOSIS — E1165 Type 2 diabetes mellitus with hyperglycemia: Secondary | ICD-10-CM | POA: Diagnosis not present

## 2016-04-03 DIAGNOSIS — Z23 Encounter for immunization: Secondary | ICD-10-CM | POA: Diagnosis not present

## 2016-04-03 DIAGNOSIS — N183 Chronic kidney disease, stage 3 (moderate): Secondary | ICD-10-CM | POA: Diagnosis not present

## 2016-04-03 DIAGNOSIS — I2692 Saddle embolus of pulmonary artery without acute cor pulmonale: Secondary | ICD-10-CM | POA: Diagnosis not present

## 2016-04-03 DIAGNOSIS — Z0000 Encounter for general adult medical examination without abnormal findings: Secondary | ICD-10-CM | POA: Diagnosis not present

## 2016-04-03 DIAGNOSIS — E039 Hypothyroidism, unspecified: Secondary | ICD-10-CM | POA: Diagnosis not present

## 2016-04-03 DIAGNOSIS — E785 Hyperlipidemia, unspecified: Secondary | ICD-10-CM | POA: Diagnosis not present

## 2016-04-03 DIAGNOSIS — E1122 Type 2 diabetes mellitus with diabetic chronic kidney disease: Secondary | ICD-10-CM | POA: Diagnosis not present

## 2016-04-03 DIAGNOSIS — Z794 Long term (current) use of insulin: Secondary | ICD-10-CM | POA: Diagnosis not present

## 2016-04-03 DIAGNOSIS — Z79899 Other long term (current) drug therapy: Secondary | ICD-10-CM | POA: Diagnosis not present

## 2016-04-07 DIAGNOSIS — Z7901 Long term (current) use of anticoagulants: Secondary | ICD-10-CM | POA: Diagnosis not present

## 2016-04-21 DIAGNOSIS — Z7901 Long term (current) use of anticoagulants: Secondary | ICD-10-CM | POA: Diagnosis not present

## 2016-05-22 DIAGNOSIS — Z7901 Long term (current) use of anticoagulants: Secondary | ICD-10-CM | POA: Diagnosis not present

## 2016-06-06 DIAGNOSIS — Z7901 Long term (current) use of anticoagulants: Secondary | ICD-10-CM | POA: Diagnosis not present

## 2016-06-13 DIAGNOSIS — Z7901 Long term (current) use of anticoagulants: Secondary | ICD-10-CM | POA: Diagnosis not present

## 2016-06-20 DIAGNOSIS — Z7901 Long term (current) use of anticoagulants: Secondary | ICD-10-CM | POA: Diagnosis not present

## 2016-07-15 DIAGNOSIS — E1165 Type 2 diabetes mellitus with hyperglycemia: Secondary | ICD-10-CM | POA: Diagnosis not present

## 2016-07-15 DIAGNOSIS — E1122 Type 2 diabetes mellitus with diabetic chronic kidney disease: Secondary | ICD-10-CM | POA: Diagnosis not present

## 2016-07-15 DIAGNOSIS — Z79899 Other long term (current) drug therapy: Secondary | ICD-10-CM | POA: Diagnosis not present

## 2016-07-15 DIAGNOSIS — E785 Hyperlipidemia, unspecified: Secondary | ICD-10-CM | POA: Diagnosis not present

## 2016-07-15 DIAGNOSIS — Z794 Long term (current) use of insulin: Secondary | ICD-10-CM | POA: Diagnosis not present

## 2016-07-15 DIAGNOSIS — Z7901 Long term (current) use of anticoagulants: Secondary | ICD-10-CM | POA: Diagnosis not present

## 2016-07-15 DIAGNOSIS — I2692 Saddle embolus of pulmonary artery without acute cor pulmonale: Secondary | ICD-10-CM | POA: Diagnosis not present

## 2016-07-15 DIAGNOSIS — R0602 Shortness of breath: Secondary | ICD-10-CM | POA: Diagnosis not present

## 2016-07-15 DIAGNOSIS — N183 Chronic kidney disease, stage 3 (moderate): Secondary | ICD-10-CM | POA: Diagnosis not present

## 2016-07-28 DIAGNOSIS — Z7901 Long term (current) use of anticoagulants: Secondary | ICD-10-CM | POA: Diagnosis not present

## 2016-07-30 DIAGNOSIS — M1712 Unilateral primary osteoarthritis, left knee: Secondary | ICD-10-CM | POA: Diagnosis not present

## 2016-07-30 DIAGNOSIS — M1711 Unilateral primary osteoarthritis, right knee: Secondary | ICD-10-CM | POA: Diagnosis not present

## 2016-08-05 DIAGNOSIS — Z8673 Personal history of transient ischemic attack (TIA), and cerebral infarction without residual deficits: Secondary | ICD-10-CM | POA: Diagnosis not present

## 2016-08-05 DIAGNOSIS — E1142 Type 2 diabetes mellitus with diabetic polyneuropathy: Secondary | ICD-10-CM | POA: Diagnosis not present

## 2016-08-05 DIAGNOSIS — K219 Gastro-esophageal reflux disease without esophagitis: Secondary | ICD-10-CM | POA: Diagnosis not present

## 2016-08-05 DIAGNOSIS — G894 Chronic pain syndrome: Secondary | ICD-10-CM | POA: Diagnosis not present

## 2016-08-05 DIAGNOSIS — M170 Bilateral primary osteoarthritis of knee: Secondary | ICD-10-CM | POA: Diagnosis not present

## 2016-08-05 DIAGNOSIS — F419 Anxiety disorder, unspecified: Secondary | ICD-10-CM | POA: Diagnosis not present

## 2016-08-11 DIAGNOSIS — Z7901 Long term (current) use of anticoagulants: Secondary | ICD-10-CM | POA: Diagnosis not present

## 2016-08-13 DIAGNOSIS — M1712 Unilateral primary osteoarthritis, left knee: Secondary | ICD-10-CM | POA: Diagnosis not present

## 2016-08-15 DIAGNOSIS — M25561 Pain in right knee: Secondary | ICD-10-CM | POA: Diagnosis not present

## 2016-08-15 DIAGNOSIS — M1711 Unilateral primary osteoarthritis, right knee: Secondary | ICD-10-CM | POA: Diagnosis not present

## 2016-08-29 DIAGNOSIS — M1711 Unilateral primary osteoarthritis, right knee: Secondary | ICD-10-CM | POA: Diagnosis not present

## 2016-09-05 DIAGNOSIS — M1711 Unilateral primary osteoarthritis, right knee: Secondary | ICD-10-CM | POA: Diagnosis not present

## 2016-09-08 DIAGNOSIS — Z7901 Long term (current) use of anticoagulants: Secondary | ICD-10-CM | POA: Diagnosis not present

## 2016-09-09 DIAGNOSIS — F419 Anxiety disorder, unspecified: Secondary | ICD-10-CM | POA: Diagnosis not present

## 2016-09-09 DIAGNOSIS — E1142 Type 2 diabetes mellitus with diabetic polyneuropathy: Secondary | ICD-10-CM | POA: Diagnosis not present

## 2016-09-09 DIAGNOSIS — G894 Chronic pain syndrome: Secondary | ICD-10-CM | POA: Diagnosis not present

## 2016-09-09 DIAGNOSIS — M170 Bilateral primary osteoarthritis of knee: Secondary | ICD-10-CM | POA: Diagnosis not present

## 2016-09-09 DIAGNOSIS — K219 Gastro-esophageal reflux disease without esophagitis: Secondary | ICD-10-CM | POA: Diagnosis not present

## 2016-09-09 DIAGNOSIS — Z8673 Personal history of transient ischemic attack (TIA), and cerebral infarction without residual deficits: Secondary | ICD-10-CM | POA: Diagnosis not present

## 2016-09-15 DIAGNOSIS — M1711 Unilateral primary osteoarthritis, right knee: Secondary | ICD-10-CM | POA: Diagnosis not present

## 2016-09-18 DIAGNOSIS — L723 Sebaceous cyst: Secondary | ICD-10-CM | POA: Diagnosis not present

## 2016-09-18 DIAGNOSIS — I119 Hypertensive heart disease without heart failure: Secondary | ICD-10-CM | POA: Diagnosis not present

## 2016-09-18 DIAGNOSIS — L732 Hidradenitis suppurativa: Secondary | ICD-10-CM | POA: Diagnosis not present

## 2016-09-18 DIAGNOSIS — E039 Hypothyroidism, unspecified: Secondary | ICD-10-CM | POA: Diagnosis not present

## 2016-09-18 DIAGNOSIS — E1122 Type 2 diabetes mellitus with diabetic chronic kidney disease: Secondary | ICD-10-CM | POA: Diagnosis not present

## 2016-10-09 DIAGNOSIS — Z7901 Long term (current) use of anticoagulants: Secondary | ICD-10-CM | POA: Diagnosis not present

## 2016-10-14 DIAGNOSIS — M170 Bilateral primary osteoarthritis of knee: Secondary | ICD-10-CM | POA: Diagnosis not present

## 2016-10-14 DIAGNOSIS — Z8673 Personal history of transient ischemic attack (TIA), and cerebral infarction without residual deficits: Secondary | ICD-10-CM | POA: Diagnosis not present

## 2016-10-14 DIAGNOSIS — E1142 Type 2 diabetes mellitus with diabetic polyneuropathy: Secondary | ICD-10-CM | POA: Diagnosis not present

## 2016-10-14 DIAGNOSIS — F419 Anxiety disorder, unspecified: Secondary | ICD-10-CM | POA: Diagnosis not present

## 2016-10-14 DIAGNOSIS — G894 Chronic pain syndrome: Secondary | ICD-10-CM | POA: Diagnosis not present

## 2016-10-14 DIAGNOSIS — K219 Gastro-esophageal reflux disease without esophagitis: Secondary | ICD-10-CM | POA: Diagnosis not present

## 2016-10-24 DIAGNOSIS — Z7901 Long term (current) use of anticoagulants: Secondary | ICD-10-CM | POA: Diagnosis not present

## 2016-10-31 DIAGNOSIS — M1712 Unilateral primary osteoarthritis, left knee: Secondary | ICD-10-CM | POA: Diagnosis not present

## 2016-11-03 DIAGNOSIS — B372 Candidiasis of skin and nail: Secondary | ICD-10-CM | POA: Diagnosis not present

## 2016-11-03 DIAGNOSIS — Z794 Long term (current) use of insulin: Secondary | ICD-10-CM | POA: Diagnosis not present

## 2016-11-03 DIAGNOSIS — N183 Chronic kidney disease, stage 3 (moderate): Secondary | ICD-10-CM | POA: Diagnosis not present

## 2016-11-03 DIAGNOSIS — M159 Polyosteoarthritis, unspecified: Secondary | ICD-10-CM | POA: Diagnosis not present

## 2016-11-03 DIAGNOSIS — E039 Hypothyroidism, unspecified: Secondary | ICD-10-CM | POA: Diagnosis not present

## 2016-11-03 DIAGNOSIS — I119 Hypertensive heart disease without heart failure: Secondary | ICD-10-CM | POA: Diagnosis not present

## 2016-11-03 DIAGNOSIS — E1122 Type 2 diabetes mellitus with diabetic chronic kidney disease: Secondary | ICD-10-CM | POA: Diagnosis not present

## 2016-11-03 DIAGNOSIS — E1165 Type 2 diabetes mellitus with hyperglycemia: Secondary | ICD-10-CM | POA: Diagnosis not present

## 2016-11-12 DIAGNOSIS — M1711 Unilateral primary osteoarthritis, right knee: Secondary | ICD-10-CM | POA: Diagnosis not present

## 2016-11-21 DIAGNOSIS — K219 Gastro-esophageal reflux disease without esophagitis: Secondary | ICD-10-CM | POA: Diagnosis not present

## 2016-11-21 DIAGNOSIS — F419 Anxiety disorder, unspecified: Secondary | ICD-10-CM | POA: Diagnosis not present

## 2016-11-21 DIAGNOSIS — G894 Chronic pain syndrome: Secondary | ICD-10-CM | POA: Diagnosis not present

## 2016-11-21 DIAGNOSIS — M170 Bilateral primary osteoarthritis of knee: Secondary | ICD-10-CM | POA: Diagnosis not present

## 2016-11-21 DIAGNOSIS — Z8673 Personal history of transient ischemic attack (TIA), and cerebral infarction without residual deficits: Secondary | ICD-10-CM | POA: Diagnosis not present

## 2016-11-21 DIAGNOSIS — E1142 Type 2 diabetes mellitus with diabetic polyneuropathy: Secondary | ICD-10-CM | POA: Diagnosis not present

## 2016-11-24 DIAGNOSIS — Z7901 Long term (current) use of anticoagulants: Secondary | ICD-10-CM | POA: Diagnosis not present

## 2016-12-23 DIAGNOSIS — E1142 Type 2 diabetes mellitus with diabetic polyneuropathy: Secondary | ICD-10-CM | POA: Diagnosis not present

## 2016-12-23 DIAGNOSIS — K219 Gastro-esophageal reflux disease without esophagitis: Secondary | ICD-10-CM | POA: Diagnosis not present

## 2016-12-23 DIAGNOSIS — F419 Anxiety disorder, unspecified: Secondary | ICD-10-CM | POA: Diagnosis not present

## 2016-12-23 DIAGNOSIS — G894 Chronic pain syndrome: Secondary | ICD-10-CM | POA: Diagnosis not present

## 2016-12-23 DIAGNOSIS — M170 Bilateral primary osteoarthritis of knee: Secondary | ICD-10-CM | POA: Diagnosis not present

## 2016-12-23 DIAGNOSIS — Z8673 Personal history of transient ischemic attack (TIA), and cerebral infarction without residual deficits: Secondary | ICD-10-CM | POA: Diagnosis not present

## 2016-12-23 DIAGNOSIS — Z7901 Long term (current) use of anticoagulants: Secondary | ICD-10-CM | POA: Diagnosis not present

## 2016-12-31 DIAGNOSIS — Z7901 Long term (current) use of anticoagulants: Secondary | ICD-10-CM | POA: Diagnosis not present

## 2017-01-14 DIAGNOSIS — Z7901 Long term (current) use of anticoagulants: Secondary | ICD-10-CM | POA: Diagnosis not present

## 2017-01-21 DIAGNOSIS — M170 Bilateral primary osteoarthritis of knee: Secondary | ICD-10-CM | POA: Diagnosis not present

## 2017-01-21 DIAGNOSIS — F419 Anxiety disorder, unspecified: Secondary | ICD-10-CM | POA: Diagnosis not present

## 2017-01-21 DIAGNOSIS — Z8673 Personal history of transient ischemic attack (TIA), and cerebral infarction without residual deficits: Secondary | ICD-10-CM | POA: Diagnosis not present

## 2017-01-21 DIAGNOSIS — G894 Chronic pain syndrome: Secondary | ICD-10-CM | POA: Diagnosis not present

## 2017-01-21 DIAGNOSIS — K219 Gastro-esophageal reflux disease without esophagitis: Secondary | ICD-10-CM | POA: Diagnosis not present

## 2017-01-21 DIAGNOSIS — E1142 Type 2 diabetes mellitus with diabetic polyneuropathy: Secondary | ICD-10-CM | POA: Diagnosis not present

## 2017-01-27 DIAGNOSIS — Z7901 Long term (current) use of anticoagulants: Secondary | ICD-10-CM | POA: Diagnosis not present

## 2017-01-28 DIAGNOSIS — M1712 Unilateral primary osteoarthritis, left knee: Secondary | ICD-10-CM | POA: Diagnosis not present

## 2017-02-13 DIAGNOSIS — Z7901 Long term (current) use of anticoagulants: Secondary | ICD-10-CM | POA: Diagnosis not present

## 2017-02-13 DIAGNOSIS — M1611 Unilateral primary osteoarthritis, right hip: Secondary | ICD-10-CM | POA: Diagnosis not present

## 2017-02-25 DIAGNOSIS — E1142 Type 2 diabetes mellitus with diabetic polyneuropathy: Secondary | ICD-10-CM | POA: Diagnosis not present

## 2017-02-25 DIAGNOSIS — M170 Bilateral primary osteoarthritis of knee: Secondary | ICD-10-CM | POA: Diagnosis not present

## 2017-02-25 DIAGNOSIS — K219 Gastro-esophageal reflux disease without esophagitis: Secondary | ICD-10-CM | POA: Diagnosis not present

## 2017-02-25 DIAGNOSIS — G894 Chronic pain syndrome: Secondary | ICD-10-CM | POA: Diagnosis not present

## 2017-02-25 DIAGNOSIS — Z8673 Personal history of transient ischemic attack (TIA), and cerebral infarction without residual deficits: Secondary | ICD-10-CM | POA: Diagnosis not present

## 2017-02-25 DIAGNOSIS — F419 Anxiety disorder, unspecified: Secondary | ICD-10-CM | POA: Diagnosis not present

## 2017-02-27 DIAGNOSIS — Z7901 Long term (current) use of anticoagulants: Secondary | ICD-10-CM | POA: Diagnosis not present

## 2017-03-04 DIAGNOSIS — N183 Chronic kidney disease, stage 3 (moderate): Secondary | ICD-10-CM | POA: Diagnosis not present

## 2017-03-04 DIAGNOSIS — E1165 Type 2 diabetes mellitus with hyperglycemia: Secondary | ICD-10-CM | POA: Diagnosis not present

## 2017-03-04 DIAGNOSIS — Z794 Long term (current) use of insulin: Secondary | ICD-10-CM | POA: Diagnosis not present

## 2017-03-04 DIAGNOSIS — E785 Hyperlipidemia, unspecified: Secondary | ICD-10-CM | POA: Diagnosis not present

## 2017-03-04 DIAGNOSIS — E1122 Type 2 diabetes mellitus with diabetic chronic kidney disease: Secondary | ICD-10-CM | POA: Diagnosis not present

## 2017-03-04 DIAGNOSIS — Z79899 Other long term (current) drug therapy: Secondary | ICD-10-CM | POA: Diagnosis not present

## 2017-03-04 DIAGNOSIS — I119 Hypertensive heart disease without heart failure: Secondary | ICD-10-CM | POA: Diagnosis not present

## 2017-03-04 DIAGNOSIS — E039 Hypothyroidism, unspecified: Secondary | ICD-10-CM | POA: Diagnosis not present

## 2017-03-13 DIAGNOSIS — Z7901 Long term (current) use of anticoagulants: Secondary | ICD-10-CM | POA: Diagnosis not present

## 2017-03-20 DIAGNOSIS — Z23 Encounter for immunization: Secondary | ICD-10-CM | POA: Diagnosis not present

## 2017-03-20 DIAGNOSIS — Z7901 Long term (current) use of anticoagulants: Secondary | ICD-10-CM | POA: Diagnosis not present

## 2017-03-25 DIAGNOSIS — G894 Chronic pain syndrome: Secondary | ICD-10-CM | POA: Diagnosis not present

## 2017-03-25 DIAGNOSIS — F419 Anxiety disorder, unspecified: Secondary | ICD-10-CM | POA: Diagnosis not present

## 2017-03-25 DIAGNOSIS — K219 Gastro-esophageal reflux disease without esophagitis: Secondary | ICD-10-CM | POA: Diagnosis not present

## 2017-03-25 DIAGNOSIS — E1142 Type 2 diabetes mellitus with diabetic polyneuropathy: Secondary | ICD-10-CM | POA: Diagnosis not present

## 2017-03-25 DIAGNOSIS — Z8673 Personal history of transient ischemic attack (TIA), and cerebral infarction without residual deficits: Secondary | ICD-10-CM | POA: Diagnosis not present

## 2017-03-25 DIAGNOSIS — M170 Bilateral primary osteoarthritis of knee: Secondary | ICD-10-CM | POA: Diagnosis not present

## 2017-03-30 DIAGNOSIS — I872 Venous insufficiency (chronic) (peripheral): Secondary | ICD-10-CM | POA: Diagnosis not present

## 2017-04-02 DIAGNOSIS — L97211 Non-pressure chronic ulcer of right calf limited to breakdown of skin: Secondary | ICD-10-CM | POA: Diagnosis not present

## 2017-04-02 DIAGNOSIS — F329 Major depressive disorder, single episode, unspecified: Secondary | ICD-10-CM | POA: Diagnosis not present

## 2017-04-02 DIAGNOSIS — I872 Venous insufficiency (chronic) (peripheral): Secondary | ICD-10-CM | POA: Diagnosis not present

## 2017-04-02 DIAGNOSIS — R600 Localized edema: Secondary | ICD-10-CM | POA: Diagnosis not present

## 2017-04-02 DIAGNOSIS — I2692 Saddle embolus of pulmonary artery without acute cor pulmonale: Secondary | ICD-10-CM | POA: Diagnosis not present

## 2017-04-02 DIAGNOSIS — I129 Hypertensive chronic kidney disease with stage 1 through stage 4 chronic kidney disease, or unspecified chronic kidney disease: Secondary | ICD-10-CM | POA: Diagnosis not present

## 2017-04-02 DIAGNOSIS — Z6841 Body Mass Index (BMI) 40.0 and over, adult: Secondary | ICD-10-CM | POA: Diagnosis not present

## 2017-04-02 DIAGNOSIS — Z7901 Long term (current) use of anticoagulants: Secondary | ICD-10-CM | POA: Diagnosis not present

## 2017-04-02 DIAGNOSIS — E1165 Type 2 diabetes mellitus with hyperglycemia: Secondary | ICD-10-CM | POA: Diagnosis not present

## 2017-04-02 DIAGNOSIS — E1142 Type 2 diabetes mellitus with diabetic polyneuropathy: Secondary | ICD-10-CM | POA: Diagnosis not present

## 2017-04-02 DIAGNOSIS — Z8673 Personal history of transient ischemic attack (TIA), and cerebral infarction without residual deficits: Secondary | ICD-10-CM | POA: Diagnosis not present

## 2017-04-02 DIAGNOSIS — Z7984 Long term (current) use of oral hypoglycemic drugs: Secondary | ICD-10-CM | POA: Diagnosis not present

## 2017-04-02 DIAGNOSIS — E1122 Type 2 diabetes mellitus with diabetic chronic kidney disease: Secondary | ICD-10-CM | POA: Diagnosis not present

## 2017-04-02 DIAGNOSIS — N183 Chronic kidney disease, stage 3 (moderate): Secondary | ICD-10-CM | POA: Diagnosis not present

## 2017-04-02 DIAGNOSIS — M1991 Primary osteoarthritis, unspecified site: Secondary | ICD-10-CM | POA: Diagnosis not present

## 2017-04-03 DIAGNOSIS — E1165 Type 2 diabetes mellitus with hyperglycemia: Secondary | ICD-10-CM | POA: Diagnosis not present

## 2017-04-03 DIAGNOSIS — E1142 Type 2 diabetes mellitus with diabetic polyneuropathy: Secondary | ICD-10-CM | POA: Diagnosis not present

## 2017-04-03 DIAGNOSIS — F329 Major depressive disorder, single episode, unspecified: Secondary | ICD-10-CM | POA: Diagnosis not present

## 2017-04-03 DIAGNOSIS — I872 Venous insufficiency (chronic) (peripheral): Secondary | ICD-10-CM | POA: Diagnosis not present

## 2017-04-03 DIAGNOSIS — R600 Localized edema: Secondary | ICD-10-CM | POA: Diagnosis not present

## 2017-04-03 DIAGNOSIS — L97211 Non-pressure chronic ulcer of right calf limited to breakdown of skin: Secondary | ICD-10-CM | POA: Diagnosis not present

## 2017-04-07 DIAGNOSIS — I872 Venous insufficiency (chronic) (peripheral): Secondary | ICD-10-CM | POA: Diagnosis not present

## 2017-04-07 DIAGNOSIS — L97211 Non-pressure chronic ulcer of right calf limited to breakdown of skin: Secondary | ICD-10-CM | POA: Diagnosis not present

## 2017-04-07 DIAGNOSIS — R600 Localized edema: Secondary | ICD-10-CM | POA: Diagnosis not present

## 2017-04-07 DIAGNOSIS — F329 Major depressive disorder, single episode, unspecified: Secondary | ICD-10-CM | POA: Diagnosis not present

## 2017-04-07 DIAGNOSIS — E1142 Type 2 diabetes mellitus with diabetic polyneuropathy: Secondary | ICD-10-CM | POA: Diagnosis not present

## 2017-04-07 DIAGNOSIS — E1165 Type 2 diabetes mellitus with hyperglycemia: Secondary | ICD-10-CM | POA: Diagnosis not present

## 2017-04-09 DIAGNOSIS — I872 Venous insufficiency (chronic) (peripheral): Secondary | ICD-10-CM | POA: Diagnosis not present

## 2017-04-10 DIAGNOSIS — S299XXA Unspecified injury of thorax, initial encounter: Secondary | ICD-10-CM | POA: Diagnosis not present

## 2017-04-10 DIAGNOSIS — M25511 Pain in right shoulder: Secondary | ICD-10-CM | POA: Diagnosis not present

## 2017-04-10 DIAGNOSIS — I872 Venous insufficiency (chronic) (peripheral): Secondary | ICD-10-CM | POA: Diagnosis not present

## 2017-04-10 DIAGNOSIS — E1165 Type 2 diabetes mellitus with hyperglycemia: Secondary | ICD-10-CM | POA: Diagnosis not present

## 2017-04-10 DIAGNOSIS — E1142 Type 2 diabetes mellitus with diabetic polyneuropathy: Secondary | ICD-10-CM | POA: Diagnosis not present

## 2017-04-10 DIAGNOSIS — R079 Chest pain, unspecified: Secondary | ICD-10-CM | POA: Diagnosis not present

## 2017-04-10 DIAGNOSIS — L97211 Non-pressure chronic ulcer of right calf limited to breakdown of skin: Secondary | ICD-10-CM | POA: Diagnosis not present

## 2017-04-10 DIAGNOSIS — F329 Major depressive disorder, single episode, unspecified: Secondary | ICD-10-CM | POA: Diagnosis not present

## 2017-04-10 DIAGNOSIS — R600 Localized edema: Secondary | ICD-10-CM | POA: Diagnosis not present

## 2017-04-13 DIAGNOSIS — E1142 Type 2 diabetes mellitus with diabetic polyneuropathy: Secondary | ICD-10-CM | POA: Diagnosis not present

## 2017-04-13 DIAGNOSIS — R600 Localized edema: Secondary | ICD-10-CM | POA: Diagnosis not present

## 2017-04-13 DIAGNOSIS — L97211 Non-pressure chronic ulcer of right calf limited to breakdown of skin: Secondary | ICD-10-CM | POA: Diagnosis not present

## 2017-04-13 DIAGNOSIS — F329 Major depressive disorder, single episode, unspecified: Secondary | ICD-10-CM | POA: Diagnosis not present

## 2017-04-13 DIAGNOSIS — I872 Venous insufficiency (chronic) (peripheral): Secondary | ICD-10-CM | POA: Diagnosis not present

## 2017-04-13 DIAGNOSIS — M7581 Other shoulder lesions, right shoulder: Secondary | ICD-10-CM | POA: Diagnosis not present

## 2017-04-13 DIAGNOSIS — E1165 Type 2 diabetes mellitus with hyperglycemia: Secondary | ICD-10-CM | POA: Diagnosis not present

## 2017-04-16 DIAGNOSIS — I872 Venous insufficiency (chronic) (peripheral): Secondary | ICD-10-CM | POA: Diagnosis not present

## 2017-04-16 DIAGNOSIS — L97211 Non-pressure chronic ulcer of right calf limited to breakdown of skin: Secondary | ICD-10-CM | POA: Diagnosis not present

## 2017-04-16 DIAGNOSIS — E1165 Type 2 diabetes mellitus with hyperglycemia: Secondary | ICD-10-CM | POA: Diagnosis not present

## 2017-04-16 DIAGNOSIS — E1142 Type 2 diabetes mellitus with diabetic polyneuropathy: Secondary | ICD-10-CM | POA: Diagnosis not present

## 2017-04-16 DIAGNOSIS — F329 Major depressive disorder, single episode, unspecified: Secondary | ICD-10-CM | POA: Diagnosis not present

## 2017-04-16 DIAGNOSIS — R600 Localized edema: Secondary | ICD-10-CM | POA: Diagnosis not present

## 2017-04-17 DIAGNOSIS — Z7901 Long term (current) use of anticoagulants: Secondary | ICD-10-CM | POA: Diagnosis not present

## 2017-04-20 DIAGNOSIS — H25813 Combined forms of age-related cataract, bilateral: Secondary | ICD-10-CM | POA: Diagnosis not present

## 2017-04-20 DIAGNOSIS — E113293 Type 2 diabetes mellitus with mild nonproliferative diabetic retinopathy without macular edema, bilateral: Secondary | ICD-10-CM | POA: Diagnosis not present

## 2017-04-20 DIAGNOSIS — H524 Presbyopia: Secondary | ICD-10-CM | POA: Diagnosis not present

## 2017-04-22 DIAGNOSIS — K219 Gastro-esophageal reflux disease without esophagitis: Secondary | ICD-10-CM | POA: Diagnosis not present

## 2017-04-22 DIAGNOSIS — E1142 Type 2 diabetes mellitus with diabetic polyneuropathy: Secondary | ICD-10-CM | POA: Diagnosis not present

## 2017-04-22 DIAGNOSIS — M170 Bilateral primary osteoarthritis of knee: Secondary | ICD-10-CM | POA: Diagnosis not present

## 2017-04-22 DIAGNOSIS — F419 Anxiety disorder, unspecified: Secondary | ICD-10-CM | POA: Diagnosis not present

## 2017-04-22 DIAGNOSIS — G894 Chronic pain syndrome: Secondary | ICD-10-CM | POA: Diagnosis not present

## 2017-04-22 DIAGNOSIS — Z8673 Personal history of transient ischemic attack (TIA), and cerebral infarction without residual deficits: Secondary | ICD-10-CM | POA: Diagnosis not present

## 2017-04-22 DIAGNOSIS — M19011 Primary osteoarthritis, right shoulder: Secondary | ICD-10-CM | POA: Diagnosis not present

## 2017-04-23 DIAGNOSIS — E1165 Type 2 diabetes mellitus with hyperglycemia: Secondary | ICD-10-CM | POA: Diagnosis not present

## 2017-04-23 DIAGNOSIS — E1142 Type 2 diabetes mellitus with diabetic polyneuropathy: Secondary | ICD-10-CM | POA: Diagnosis not present

## 2017-04-23 DIAGNOSIS — R600 Localized edema: Secondary | ICD-10-CM | POA: Diagnosis not present

## 2017-04-23 DIAGNOSIS — L97211 Non-pressure chronic ulcer of right calf limited to breakdown of skin: Secondary | ICD-10-CM | POA: Diagnosis not present

## 2017-04-23 DIAGNOSIS — I872 Venous insufficiency (chronic) (peripheral): Secondary | ICD-10-CM | POA: Diagnosis not present

## 2017-04-23 DIAGNOSIS — F329 Major depressive disorder, single episode, unspecified: Secondary | ICD-10-CM | POA: Diagnosis not present

## 2017-04-27 DIAGNOSIS — E1165 Type 2 diabetes mellitus with hyperglycemia: Secondary | ICD-10-CM | POA: Diagnosis not present

## 2017-04-27 DIAGNOSIS — R600 Localized edema: Secondary | ICD-10-CM | POA: Diagnosis not present

## 2017-04-27 DIAGNOSIS — L97211 Non-pressure chronic ulcer of right calf limited to breakdown of skin: Secondary | ICD-10-CM | POA: Diagnosis not present

## 2017-04-27 DIAGNOSIS — E1142 Type 2 diabetes mellitus with diabetic polyneuropathy: Secondary | ICD-10-CM | POA: Diagnosis not present

## 2017-04-27 DIAGNOSIS — I872 Venous insufficiency (chronic) (peripheral): Secondary | ICD-10-CM | POA: Diagnosis not present

## 2017-04-27 DIAGNOSIS — F329 Major depressive disorder, single episode, unspecified: Secondary | ICD-10-CM | POA: Diagnosis not present

## 2017-04-28 DIAGNOSIS — M1712 Unilateral primary osteoarthritis, left knee: Secondary | ICD-10-CM | POA: Diagnosis not present

## 2017-05-04 DIAGNOSIS — L97211 Non-pressure chronic ulcer of right calf limited to breakdown of skin: Secondary | ICD-10-CM | POA: Diagnosis not present

## 2017-05-04 DIAGNOSIS — I872 Venous insufficiency (chronic) (peripheral): Secondary | ICD-10-CM | POA: Diagnosis not present

## 2017-05-04 DIAGNOSIS — E1142 Type 2 diabetes mellitus with diabetic polyneuropathy: Secondary | ICD-10-CM | POA: Diagnosis not present

## 2017-05-04 DIAGNOSIS — E1165 Type 2 diabetes mellitus with hyperglycemia: Secondary | ICD-10-CM | POA: Diagnosis not present

## 2017-05-04 DIAGNOSIS — R600 Localized edema: Secondary | ICD-10-CM | POA: Diagnosis not present

## 2017-05-04 DIAGNOSIS — F329 Major depressive disorder, single episode, unspecified: Secondary | ICD-10-CM | POA: Diagnosis not present

## 2017-05-07 DIAGNOSIS — R609 Edema, unspecified: Secondary | ICD-10-CM | POA: Diagnosis not present

## 2017-05-07 DIAGNOSIS — I872 Venous insufficiency (chronic) (peripheral): Secondary | ICD-10-CM | POA: Diagnosis not present

## 2017-05-07 DIAGNOSIS — L981 Factitial dermatitis: Secondary | ICD-10-CM | POA: Diagnosis not present

## 2017-05-07 DIAGNOSIS — E1165 Type 2 diabetes mellitus with hyperglycemia: Secondary | ICD-10-CM | POA: Diagnosis not present

## 2017-05-07 DIAGNOSIS — F329 Major depressive disorder, single episode, unspecified: Secondary | ICD-10-CM | POA: Diagnosis not present

## 2017-05-07 DIAGNOSIS — R600 Localized edema: Secondary | ICD-10-CM | POA: Diagnosis not present

## 2017-05-07 DIAGNOSIS — M7989 Other specified soft tissue disorders: Secondary | ICD-10-CM | POA: Diagnosis not present

## 2017-05-07 DIAGNOSIS — L97211 Non-pressure chronic ulcer of right calf limited to breakdown of skin: Secondary | ICD-10-CM | POA: Diagnosis not present

## 2017-05-07 DIAGNOSIS — E1142 Type 2 diabetes mellitus with diabetic polyneuropathy: Secondary | ICD-10-CM | POA: Diagnosis not present

## 2017-05-07 DIAGNOSIS — N183 Chronic kidney disease, stage 3 (moderate): Secondary | ICD-10-CM | POA: Diagnosis not present

## 2017-05-11 DIAGNOSIS — E1142 Type 2 diabetes mellitus with diabetic polyneuropathy: Secondary | ICD-10-CM | POA: Diagnosis not present

## 2017-05-11 DIAGNOSIS — R600 Localized edema: Secondary | ICD-10-CM | POA: Diagnosis not present

## 2017-05-11 DIAGNOSIS — I872 Venous insufficiency (chronic) (peripheral): Secondary | ICD-10-CM | POA: Diagnosis not present

## 2017-05-11 DIAGNOSIS — L97211 Non-pressure chronic ulcer of right calf limited to breakdown of skin: Secondary | ICD-10-CM | POA: Diagnosis not present

## 2017-05-11 DIAGNOSIS — E1165 Type 2 diabetes mellitus with hyperglycemia: Secondary | ICD-10-CM | POA: Diagnosis not present

## 2017-05-11 DIAGNOSIS — F329 Major depressive disorder, single episode, unspecified: Secondary | ICD-10-CM | POA: Diagnosis not present

## 2017-05-13 DIAGNOSIS — M7581 Other shoulder lesions, right shoulder: Secondary | ICD-10-CM | POA: Diagnosis not present

## 2017-05-13 DIAGNOSIS — M25511 Pain in right shoulder: Secondary | ICD-10-CM | POA: Diagnosis not present

## 2017-05-14 DIAGNOSIS — R600 Localized edema: Secondary | ICD-10-CM | POA: Diagnosis not present

## 2017-05-14 DIAGNOSIS — I872 Venous insufficiency (chronic) (peripheral): Secondary | ICD-10-CM | POA: Diagnosis not present

## 2017-05-14 DIAGNOSIS — L97211 Non-pressure chronic ulcer of right calf limited to breakdown of skin: Secondary | ICD-10-CM | POA: Diagnosis not present

## 2017-05-14 DIAGNOSIS — E1142 Type 2 diabetes mellitus with diabetic polyneuropathy: Secondary | ICD-10-CM | POA: Diagnosis not present

## 2017-05-14 DIAGNOSIS — E1165 Type 2 diabetes mellitus with hyperglycemia: Secondary | ICD-10-CM | POA: Diagnosis not present

## 2017-05-14 DIAGNOSIS — F329 Major depressive disorder, single episode, unspecified: Secondary | ICD-10-CM | POA: Diagnosis not present

## 2017-05-18 DIAGNOSIS — Z7901 Long term (current) use of anticoagulants: Secondary | ICD-10-CM | POA: Diagnosis not present

## 2017-05-18 DIAGNOSIS — M1711 Unilateral primary osteoarthritis, right knee: Secondary | ICD-10-CM | POA: Diagnosis not present

## 2017-05-18 DIAGNOSIS — M47816 Spondylosis without myelopathy or radiculopathy, lumbar region: Secondary | ICD-10-CM | POA: Diagnosis not present

## 2017-05-18 DIAGNOSIS — M461 Sacroiliitis, not elsewhere classified: Secondary | ICD-10-CM | POA: Diagnosis not present

## 2017-05-18 DIAGNOSIS — R609 Edema, unspecified: Secondary | ICD-10-CM | POA: Diagnosis not present

## 2017-05-18 DIAGNOSIS — R0602 Shortness of breath: Secondary | ICD-10-CM | POA: Diagnosis not present

## 2017-05-20 DIAGNOSIS — G894 Chronic pain syndrome: Secondary | ICD-10-CM | POA: Diagnosis not present

## 2017-05-20 DIAGNOSIS — F419 Anxiety disorder, unspecified: Secondary | ICD-10-CM | POA: Diagnosis not present

## 2017-05-20 DIAGNOSIS — K219 Gastro-esophageal reflux disease without esophagitis: Secondary | ICD-10-CM | POA: Diagnosis not present

## 2017-05-20 DIAGNOSIS — Z8673 Personal history of transient ischemic attack (TIA), and cerebral infarction without residual deficits: Secondary | ICD-10-CM | POA: Diagnosis not present

## 2017-05-20 DIAGNOSIS — M170 Bilateral primary osteoarthritis of knee: Secondary | ICD-10-CM | POA: Diagnosis not present

## 2017-05-20 DIAGNOSIS — E1142 Type 2 diabetes mellitus with diabetic polyneuropathy: Secondary | ICD-10-CM | POA: Diagnosis not present

## 2017-05-20 DIAGNOSIS — M19011 Primary osteoarthritis, right shoulder: Secondary | ICD-10-CM | POA: Diagnosis not present

## 2017-05-21 DIAGNOSIS — I4510 Unspecified right bundle-branch block: Secondary | ICD-10-CM | POA: Diagnosis not present

## 2017-05-21 DIAGNOSIS — R9431 Abnormal electrocardiogram [ECG] [EKG]: Secondary | ICD-10-CM | POA: Diagnosis not present

## 2017-05-21 DIAGNOSIS — R002 Palpitations: Secondary | ICD-10-CM | POA: Diagnosis not present

## 2017-05-24 DIAGNOSIS — I872 Venous insufficiency (chronic) (peripheral): Secondary | ICD-10-CM | POA: Diagnosis not present

## 2017-05-24 DIAGNOSIS — L97211 Non-pressure chronic ulcer of right calf limited to breakdown of skin: Secondary | ICD-10-CM | POA: Diagnosis not present

## 2017-05-24 DIAGNOSIS — R600 Localized edema: Secondary | ICD-10-CM | POA: Diagnosis not present

## 2017-05-24 DIAGNOSIS — F329 Major depressive disorder, single episode, unspecified: Secondary | ICD-10-CM | POA: Diagnosis not present

## 2017-05-24 DIAGNOSIS — E1142 Type 2 diabetes mellitus with diabetic polyneuropathy: Secondary | ICD-10-CM | POA: Diagnosis not present

## 2017-05-24 DIAGNOSIS — E1165 Type 2 diabetes mellitus with hyperglycemia: Secondary | ICD-10-CM | POA: Diagnosis not present

## 2017-05-25 DIAGNOSIS — Z79899 Other long term (current) drug therapy: Secondary | ICD-10-CM | POA: Diagnosis not present

## 2017-05-25 DIAGNOSIS — E1165 Type 2 diabetes mellitus with hyperglycemia: Secondary | ICD-10-CM | POA: Diagnosis not present

## 2017-05-25 DIAGNOSIS — Z7901 Long term (current) use of anticoagulants: Secondary | ICD-10-CM | POA: Diagnosis not present

## 2017-05-26 DIAGNOSIS — M25531 Pain in right wrist: Secondary | ICD-10-CM | POA: Diagnosis not present

## 2017-05-26 DIAGNOSIS — S59901A Unspecified injury of right elbow, initial encounter: Secondary | ICD-10-CM | POA: Diagnosis not present

## 2017-05-26 DIAGNOSIS — S59911A Unspecified injury of right forearm, initial encounter: Secondary | ICD-10-CM | POA: Diagnosis not present

## 2017-05-26 DIAGNOSIS — M25521 Pain in right elbow: Secondary | ICD-10-CM | POA: Diagnosis not present

## 2017-05-26 DIAGNOSIS — M79631 Pain in right forearm: Secondary | ICD-10-CM | POA: Diagnosis not present

## 2017-05-26 DIAGNOSIS — M79601 Pain in right arm: Secondary | ICD-10-CM | POA: Diagnosis not present

## 2017-05-26 DIAGNOSIS — S40021A Contusion of right upper arm, initial encounter: Secondary | ICD-10-CM | POA: Diagnosis not present

## 2017-05-26 DIAGNOSIS — S4991XA Unspecified injury of right shoulder and upper arm, initial encounter: Secondary | ICD-10-CM | POA: Diagnosis not present

## 2017-05-26 DIAGNOSIS — M25511 Pain in right shoulder: Secondary | ICD-10-CM | POA: Diagnosis not present

## 2017-05-27 DIAGNOSIS — I517 Cardiomegaly: Secondary | ICD-10-CM | POA: Diagnosis not present

## 2017-05-29 DIAGNOSIS — E1165 Type 2 diabetes mellitus with hyperglycemia: Secondary | ICD-10-CM | POA: Diagnosis not present

## 2017-05-29 DIAGNOSIS — E1142 Type 2 diabetes mellitus with diabetic polyneuropathy: Secondary | ICD-10-CM | POA: Diagnosis not present

## 2017-05-29 DIAGNOSIS — F329 Major depressive disorder, single episode, unspecified: Secondary | ICD-10-CM | POA: Diagnosis not present

## 2017-05-29 DIAGNOSIS — I872 Venous insufficiency (chronic) (peripheral): Secondary | ICD-10-CM | POA: Diagnosis not present

## 2017-05-29 DIAGNOSIS — L97211 Non-pressure chronic ulcer of right calf limited to breakdown of skin: Secondary | ICD-10-CM | POA: Diagnosis not present

## 2017-05-29 DIAGNOSIS — R600 Localized edema: Secondary | ICD-10-CM | POA: Diagnosis not present

## 2017-06-01 DIAGNOSIS — M1991 Primary osteoarthritis, unspecified site: Secondary | ICD-10-CM | POA: Diagnosis not present

## 2017-06-01 DIAGNOSIS — Z6841 Body Mass Index (BMI) 40.0 and over, adult: Secondary | ICD-10-CM | POA: Diagnosis not present

## 2017-06-01 DIAGNOSIS — Z7901 Long term (current) use of anticoagulants: Secondary | ICD-10-CM | POA: Diagnosis not present

## 2017-06-01 DIAGNOSIS — N183 Chronic kidney disease, stage 3 (moderate): Secondary | ICD-10-CM | POA: Diagnosis not present

## 2017-06-01 DIAGNOSIS — E1142 Type 2 diabetes mellitus with diabetic polyneuropathy: Secondary | ICD-10-CM | POA: Diagnosis not present

## 2017-06-01 DIAGNOSIS — I872 Venous insufficiency (chronic) (peripheral): Secondary | ICD-10-CM | POA: Diagnosis not present

## 2017-06-01 DIAGNOSIS — E1165 Type 2 diabetes mellitus with hyperglycemia: Secondary | ICD-10-CM | POA: Diagnosis not present

## 2017-06-01 DIAGNOSIS — Z7984 Long term (current) use of oral hypoglycemic drugs: Secondary | ICD-10-CM | POA: Diagnosis not present

## 2017-06-01 DIAGNOSIS — S41101A Unspecified open wound of right upper arm, initial encounter: Secondary | ICD-10-CM | POA: Diagnosis not present

## 2017-06-01 DIAGNOSIS — E1122 Type 2 diabetes mellitus with diabetic chronic kidney disease: Secondary | ICD-10-CM | POA: Diagnosis not present

## 2017-06-01 DIAGNOSIS — Z8673 Personal history of transient ischemic attack (TIA), and cerebral infarction without residual deficits: Secondary | ICD-10-CM | POA: Diagnosis not present

## 2017-06-01 DIAGNOSIS — F329 Major depressive disorder, single episode, unspecified: Secondary | ICD-10-CM | POA: Diagnosis not present

## 2017-06-01 DIAGNOSIS — I2692 Saddle embolus of pulmonary artery without acute cor pulmonale: Secondary | ICD-10-CM | POA: Diagnosis not present

## 2017-06-01 DIAGNOSIS — R600 Localized edema: Secondary | ICD-10-CM | POA: Diagnosis not present

## 2017-06-01 DIAGNOSIS — I129 Hypertensive chronic kidney disease with stage 1 through stage 4 chronic kidney disease, or unspecified chronic kidney disease: Secondary | ICD-10-CM | POA: Diagnosis not present

## 2017-06-02 DIAGNOSIS — N183 Chronic kidney disease, stage 3 (moderate): Secondary | ICD-10-CM | POA: Diagnosis not present

## 2017-06-02 DIAGNOSIS — M159 Polyosteoarthritis, unspecified: Secondary | ICD-10-CM | POA: Diagnosis not present

## 2017-06-02 DIAGNOSIS — Z0000 Encounter for general adult medical examination without abnormal findings: Secondary | ICD-10-CM | POA: Diagnosis not present

## 2017-06-02 DIAGNOSIS — B372 Candidiasis of skin and nail: Secondary | ICD-10-CM | POA: Diagnosis not present

## 2017-06-03 DIAGNOSIS — M25511 Pain in right shoulder: Secondary | ICD-10-CM | POA: Diagnosis not present

## 2017-06-03 DIAGNOSIS — S4991XA Unspecified injury of right shoulder and upper arm, initial encounter: Secondary | ICD-10-CM | POA: Diagnosis not present

## 2017-06-04 DIAGNOSIS — I2510 Atherosclerotic heart disease of native coronary artery without angina pectoris: Secondary | ICD-10-CM | POA: Diagnosis not present

## 2017-06-04 DIAGNOSIS — R079 Chest pain, unspecified: Secondary | ICD-10-CM | POA: Diagnosis not present

## 2017-06-04 DIAGNOSIS — E1165 Type 2 diabetes mellitus with hyperglycemia: Secondary | ICD-10-CM | POA: Diagnosis not present

## 2017-06-04 DIAGNOSIS — S41101A Unspecified open wound of right upper arm, initial encounter: Secondary | ICD-10-CM | POA: Diagnosis not present

## 2017-06-04 DIAGNOSIS — E1142 Type 2 diabetes mellitus with diabetic polyneuropathy: Secondary | ICD-10-CM | POA: Diagnosis not present

## 2017-06-04 DIAGNOSIS — F329 Major depressive disorder, single episode, unspecified: Secondary | ICD-10-CM | POA: Diagnosis not present

## 2017-06-04 DIAGNOSIS — R600 Localized edema: Secondary | ICD-10-CM | POA: Diagnosis not present

## 2017-06-04 DIAGNOSIS — I872 Venous insufficiency (chronic) (peripheral): Secondary | ICD-10-CM | POA: Diagnosis not present

## 2017-06-08 DIAGNOSIS — Z7901 Long term (current) use of anticoagulants: Secondary | ICD-10-CM | POA: Diagnosis not present

## 2017-06-08 DIAGNOSIS — E1142 Type 2 diabetes mellitus with diabetic polyneuropathy: Secondary | ICD-10-CM | POA: Diagnosis not present

## 2017-06-08 DIAGNOSIS — F329 Major depressive disorder, single episode, unspecified: Secondary | ICD-10-CM | POA: Diagnosis not present

## 2017-06-08 DIAGNOSIS — E1165 Type 2 diabetes mellitus with hyperglycemia: Secondary | ICD-10-CM | POA: Diagnosis not present

## 2017-06-08 DIAGNOSIS — I872 Venous insufficiency (chronic) (peripheral): Secondary | ICD-10-CM | POA: Diagnosis not present

## 2017-06-08 DIAGNOSIS — S41101A Unspecified open wound of right upper arm, initial encounter: Secondary | ICD-10-CM | POA: Diagnosis not present

## 2017-06-08 DIAGNOSIS — R600 Localized edema: Secondary | ICD-10-CM | POA: Diagnosis not present

## 2017-06-11 DIAGNOSIS — M75121 Complete rotator cuff tear or rupture of right shoulder, not specified as traumatic: Secondary | ICD-10-CM | POA: Diagnosis not present

## 2017-06-11 DIAGNOSIS — M25511 Pain in right shoulder: Secondary | ICD-10-CM | POA: Diagnosis not present

## 2017-06-11 DIAGNOSIS — M12812 Other specific arthropathies, not elsewhere classified, left shoulder: Secondary | ICD-10-CM | POA: Diagnosis not present

## 2017-06-12 DIAGNOSIS — Z7901 Long term (current) use of anticoagulants: Secondary | ICD-10-CM | POA: Diagnosis not present

## 2017-06-18 DIAGNOSIS — R600 Localized edema: Secondary | ICD-10-CM | POA: Diagnosis not present

## 2017-06-18 DIAGNOSIS — S41101A Unspecified open wound of right upper arm, initial encounter: Secondary | ICD-10-CM | POA: Diagnosis not present

## 2017-06-18 DIAGNOSIS — E1165 Type 2 diabetes mellitus with hyperglycemia: Secondary | ICD-10-CM | POA: Diagnosis not present

## 2017-06-18 DIAGNOSIS — F329 Major depressive disorder, single episode, unspecified: Secondary | ICD-10-CM | POA: Diagnosis not present

## 2017-06-18 DIAGNOSIS — I872 Venous insufficiency (chronic) (peripheral): Secondary | ICD-10-CM | POA: Diagnosis not present

## 2017-06-18 DIAGNOSIS — E1142 Type 2 diabetes mellitus with diabetic polyneuropathy: Secondary | ICD-10-CM | POA: Diagnosis not present

## 2017-06-19 DIAGNOSIS — Z7901 Long term (current) use of anticoagulants: Secondary | ICD-10-CM | POA: Diagnosis not present

## 2017-06-19 DIAGNOSIS — M170 Bilateral primary osteoarthritis of knee: Secondary | ICD-10-CM | POA: Diagnosis not present

## 2017-06-19 DIAGNOSIS — E1142 Type 2 diabetes mellitus with diabetic polyneuropathy: Secondary | ICD-10-CM | POA: Diagnosis not present

## 2017-06-19 DIAGNOSIS — M19011 Primary osteoarthritis, right shoulder: Secondary | ICD-10-CM | POA: Diagnosis not present

## 2017-06-19 DIAGNOSIS — Z8673 Personal history of transient ischemic attack (TIA), and cerebral infarction without residual deficits: Secondary | ICD-10-CM | POA: Diagnosis not present

## 2017-06-19 DIAGNOSIS — F419 Anxiety disorder, unspecified: Secondary | ICD-10-CM | POA: Diagnosis not present

## 2017-06-19 DIAGNOSIS — G894 Chronic pain syndrome: Secondary | ICD-10-CM | POA: Diagnosis not present

## 2017-06-19 DIAGNOSIS — K219 Gastro-esophageal reflux disease without esophagitis: Secondary | ICD-10-CM | POA: Diagnosis not present

## 2017-06-22 DIAGNOSIS — E1165 Type 2 diabetes mellitus with hyperglycemia: Secondary | ICD-10-CM | POA: Diagnosis not present

## 2017-06-22 DIAGNOSIS — E1142 Type 2 diabetes mellitus with diabetic polyneuropathy: Secondary | ICD-10-CM | POA: Diagnosis not present

## 2017-06-22 DIAGNOSIS — R600 Localized edema: Secondary | ICD-10-CM | POA: Diagnosis not present

## 2017-06-22 DIAGNOSIS — I872 Venous insufficiency (chronic) (peripheral): Secondary | ICD-10-CM | POA: Diagnosis not present

## 2017-06-22 DIAGNOSIS — S41101A Unspecified open wound of right upper arm, initial encounter: Secondary | ICD-10-CM | POA: Diagnosis not present

## 2017-06-22 DIAGNOSIS — F329 Major depressive disorder, single episode, unspecified: Secondary | ICD-10-CM | POA: Diagnosis not present

## 2017-06-25 DIAGNOSIS — F329 Major depressive disorder, single episode, unspecified: Secondary | ICD-10-CM | POA: Diagnosis not present

## 2017-06-25 DIAGNOSIS — R600 Localized edema: Secondary | ICD-10-CM | POA: Diagnosis not present

## 2017-06-25 DIAGNOSIS — I872 Venous insufficiency (chronic) (peripheral): Secondary | ICD-10-CM | POA: Diagnosis not present

## 2017-06-25 DIAGNOSIS — E1165 Type 2 diabetes mellitus with hyperglycemia: Secondary | ICD-10-CM | POA: Diagnosis not present

## 2017-06-25 DIAGNOSIS — E1142 Type 2 diabetes mellitus with diabetic polyneuropathy: Secondary | ICD-10-CM | POA: Diagnosis not present

## 2017-06-25 DIAGNOSIS — S41101A Unspecified open wound of right upper arm, initial encounter: Secondary | ICD-10-CM | POA: Diagnosis not present

## 2017-06-26 DIAGNOSIS — Z7901 Long term (current) use of anticoagulants: Secondary | ICD-10-CM | POA: Diagnosis not present

## 2017-07-13 DIAGNOSIS — Z7901 Long term (current) use of anticoagulants: Secondary | ICD-10-CM | POA: Diagnosis not present

## 2017-07-15 DIAGNOSIS — G894 Chronic pain syndrome: Secondary | ICD-10-CM | POA: Diagnosis not present

## 2017-07-15 DIAGNOSIS — M19011 Primary osteoarthritis, right shoulder: Secondary | ICD-10-CM | POA: Diagnosis not present

## 2017-07-15 DIAGNOSIS — M170 Bilateral primary osteoarthritis of knee: Secondary | ICD-10-CM | POA: Diagnosis not present

## 2017-07-15 DIAGNOSIS — E1142 Type 2 diabetes mellitus with diabetic polyneuropathy: Secondary | ICD-10-CM | POA: Diagnosis not present

## 2017-07-15 DIAGNOSIS — Z8673 Personal history of transient ischemic attack (TIA), and cerebral infarction without residual deficits: Secondary | ICD-10-CM | POA: Diagnosis not present

## 2017-07-15 DIAGNOSIS — K219 Gastro-esophageal reflux disease without esophagitis: Secondary | ICD-10-CM | POA: Diagnosis not present

## 2017-07-15 DIAGNOSIS — F419 Anxiety disorder, unspecified: Secondary | ICD-10-CM | POA: Diagnosis not present

## 2017-07-16 DIAGNOSIS — Z7901 Long term (current) use of anticoagulants: Secondary | ICD-10-CM | POA: Diagnosis not present

## 2017-07-23 DIAGNOSIS — Z7901 Long term (current) use of anticoagulants: Secondary | ICD-10-CM | POA: Diagnosis not present

## 2017-08-03 DIAGNOSIS — M1712 Unilateral primary osteoarthritis, left knee: Secondary | ICD-10-CM | POA: Diagnosis not present

## 2017-08-10 DIAGNOSIS — Z7901 Long term (current) use of anticoagulants: Secondary | ICD-10-CM | POA: Diagnosis not present

## 2017-08-13 DIAGNOSIS — F419 Anxiety disorder, unspecified: Secondary | ICD-10-CM | POA: Diagnosis not present

## 2017-08-13 DIAGNOSIS — Z8673 Personal history of transient ischemic attack (TIA), and cerebral infarction without residual deficits: Secondary | ICD-10-CM | POA: Diagnosis not present

## 2017-08-13 DIAGNOSIS — M170 Bilateral primary osteoarthritis of knee: Secondary | ICD-10-CM | POA: Diagnosis not present

## 2017-08-13 DIAGNOSIS — G894 Chronic pain syndrome: Secondary | ICD-10-CM | POA: Diagnosis not present

## 2017-08-13 DIAGNOSIS — M19011 Primary osteoarthritis, right shoulder: Secondary | ICD-10-CM | POA: Diagnosis not present

## 2017-08-13 DIAGNOSIS — K219 Gastro-esophageal reflux disease without esophagitis: Secondary | ICD-10-CM | POA: Diagnosis not present

## 2017-08-13 DIAGNOSIS — E1142 Type 2 diabetes mellitus with diabetic polyneuropathy: Secondary | ICD-10-CM | POA: Diagnosis not present

## 2017-08-18 DIAGNOSIS — M1711 Unilateral primary osteoarthritis, right knee: Secondary | ICD-10-CM | POA: Diagnosis not present

## 2017-08-24 DIAGNOSIS — B372 Candidiasis of skin and nail: Secondary | ICD-10-CM | POA: Diagnosis not present

## 2017-08-24 DIAGNOSIS — B379 Candidiasis, unspecified: Secondary | ICD-10-CM | POA: Diagnosis not present

## 2017-08-24 DIAGNOSIS — Z794 Long term (current) use of insulin: Secondary | ICD-10-CM | POA: Diagnosis not present

## 2017-08-24 DIAGNOSIS — E1165 Type 2 diabetes mellitus with hyperglycemia: Secondary | ICD-10-CM | POA: Diagnosis not present

## 2017-08-24 DIAGNOSIS — M159 Polyosteoarthritis, unspecified: Secondary | ICD-10-CM | POA: Diagnosis not present

## 2017-08-24 DIAGNOSIS — N183 Chronic kidney disease, stage 3 (moderate): Secondary | ICD-10-CM | POA: Diagnosis not present

## 2017-08-24 DIAGNOSIS — E1122 Type 2 diabetes mellitus with diabetic chronic kidney disease: Secondary | ICD-10-CM | POA: Diagnosis not present

## 2017-09-06 DIAGNOSIS — R1084 Generalized abdominal pain: Secondary | ICD-10-CM | POA: Diagnosis not present

## 2017-09-06 DIAGNOSIS — K5900 Constipation, unspecified: Secondary | ICD-10-CM | POA: Diagnosis not present

## 2017-09-06 DIAGNOSIS — N390 Urinary tract infection, site not specified: Secondary | ICD-10-CM | POA: Diagnosis not present

## 2017-09-06 DIAGNOSIS — R109 Unspecified abdominal pain: Secondary | ICD-10-CM | POA: Diagnosis not present

## 2017-09-06 DIAGNOSIS — R079 Chest pain, unspecified: Secondary | ICD-10-CM | POA: Diagnosis not present

## 2017-09-07 DIAGNOSIS — Z7901 Long term (current) use of anticoagulants: Secondary | ICD-10-CM | POA: Diagnosis not present

## 2017-09-08 DIAGNOSIS — Z8673 Personal history of transient ischemic attack (TIA), and cerebral infarction without residual deficits: Secondary | ICD-10-CM | POA: Diagnosis not present

## 2017-09-08 DIAGNOSIS — M19011 Primary osteoarthritis, right shoulder: Secondary | ICD-10-CM | POA: Diagnosis not present

## 2017-09-08 DIAGNOSIS — G894 Chronic pain syndrome: Secondary | ICD-10-CM | POA: Diagnosis not present

## 2017-09-08 DIAGNOSIS — M170 Bilateral primary osteoarthritis of knee: Secondary | ICD-10-CM | POA: Diagnosis not present

## 2017-09-08 DIAGNOSIS — E1142 Type 2 diabetes mellitus with diabetic polyneuropathy: Secondary | ICD-10-CM | POA: Diagnosis not present

## 2017-09-08 DIAGNOSIS — K5909 Other constipation: Secondary | ICD-10-CM | POA: Diagnosis not present

## 2017-09-08 DIAGNOSIS — F419 Anxiety disorder, unspecified: Secondary | ICD-10-CM | POA: Diagnosis not present

## 2017-09-08 DIAGNOSIS — K219 Gastro-esophageal reflux disease without esophagitis: Secondary | ICD-10-CM | POA: Diagnosis not present

## 2017-10-24 DIAGNOSIS — G9341 Metabolic encephalopathy: Secondary | ICD-10-CM

## 2017-10-24 DIAGNOSIS — I6789 Other cerebrovascular disease: Secondary | ICD-10-CM | POA: Diagnosis not present

## 2017-10-24 DIAGNOSIS — R531 Weakness: Secondary | ICD-10-CM | POA: Diagnosis not present

## 2017-10-24 DIAGNOSIS — N179 Acute kidney failure, unspecified: Secondary | ICD-10-CM

## 2017-10-24 DIAGNOSIS — I639 Cerebral infarction, unspecified: Secondary | ICD-10-CM | POA: Diagnosis not present

## 2017-10-25 DIAGNOSIS — G9341 Metabolic encephalopathy: Secondary | ICD-10-CM | POA: Diagnosis not present

## 2017-10-25 DIAGNOSIS — N179 Acute kidney failure, unspecified: Secondary | ICD-10-CM | POA: Diagnosis not present

## 2017-10-25 DIAGNOSIS — I639 Cerebral infarction, unspecified: Secondary | ICD-10-CM | POA: Diagnosis not present

## 2017-10-25 DIAGNOSIS — R531 Weakness: Secondary | ICD-10-CM | POA: Diagnosis not present

## 2017-10-26 DIAGNOSIS — G9341 Metabolic encephalopathy: Secondary | ICD-10-CM | POA: Diagnosis not present

## 2017-10-26 DIAGNOSIS — I639 Cerebral infarction, unspecified: Secondary | ICD-10-CM | POA: Diagnosis not present

## 2017-10-26 DIAGNOSIS — N179 Acute kidney failure, unspecified: Secondary | ICD-10-CM | POA: Diagnosis not present

## 2017-10-26 DIAGNOSIS — R531 Weakness: Secondary | ICD-10-CM | POA: Diagnosis not present

## 2017-10-27 ENCOUNTER — Encounter (HOSPITAL_COMMUNITY): Payer: Self-pay | Admitting: General Practice

## 2017-10-27 ENCOUNTER — Inpatient Hospital Stay (HOSPITAL_COMMUNITY)
Admission: AD | Admit: 2017-10-27 | Discharge: 2017-10-30 | DRG: 682 | Disposition: A | Discharge status: Hospice | Payer: No Typology Code available for payment source | Source: Other Acute Inpatient Hospital | Attending: Internal Medicine | Admitting: Internal Medicine

## 2017-10-27 ENCOUNTER — Other Ambulatory Visit: Payer: Self-pay

## 2017-10-27 DIAGNOSIS — Z86711 Personal history of pulmonary embolism: Secondary | ICD-10-CM | POA: Diagnosis not present

## 2017-10-27 DIAGNOSIS — Z515 Encounter for palliative care: Secondary | ICD-10-CM | POA: Diagnosis not present

## 2017-10-27 DIAGNOSIS — Z7901 Long term (current) use of anticoagulants: Secondary | ICD-10-CM

## 2017-10-27 DIAGNOSIS — Z66 Do not resuscitate: Secondary | ICD-10-CM | POA: Diagnosis present

## 2017-10-27 DIAGNOSIS — E875 Hyperkalemia: Secondary | ICD-10-CM | POA: Diagnosis not present

## 2017-10-27 DIAGNOSIS — E790 Hyperuricemia without signs of inflammatory arthritis and tophaceous disease: Secondary | ICD-10-CM | POA: Diagnosis present

## 2017-10-27 DIAGNOSIS — N183 Chronic kidney disease, stage 3 (moderate): Secondary | ICD-10-CM | POA: Diagnosis present

## 2017-10-27 DIAGNOSIS — R531 Weakness: Secondary | ICD-10-CM | POA: Diagnosis present

## 2017-10-27 DIAGNOSIS — Z6841 Body Mass Index (BMI) 40.0 and over, adult: Secondary | ICD-10-CM

## 2017-10-27 DIAGNOSIS — I130 Hypertensive heart and chronic kidney disease with heart failure and stage 1 through stage 4 chronic kidney disease, or unspecified chronic kidney disease: Secondary | ICD-10-CM | POA: Diagnosis present

## 2017-10-27 DIAGNOSIS — Z794 Long term (current) use of insulin: Secondary | ICD-10-CM

## 2017-10-27 DIAGNOSIS — N170 Acute kidney failure with tubular necrosis: Secondary | ICD-10-CM | POA: Diagnosis present

## 2017-10-27 DIAGNOSIS — I5032 Chronic diastolic (congestive) heart failure: Secondary | ICD-10-CM | POA: Diagnosis present

## 2017-10-27 DIAGNOSIS — I69351 Hemiplegia and hemiparesis following cerebral infarction affecting right dominant side: Secondary | ICD-10-CM

## 2017-10-27 DIAGNOSIS — E271 Primary adrenocortical insufficiency: Secondary | ICD-10-CM | POA: Diagnosis present

## 2017-10-27 DIAGNOSIS — Z7189 Other specified counseling: Secondary | ICD-10-CM

## 2017-10-27 DIAGNOSIS — E039 Hypothyroidism, unspecified: Secondary | ICD-10-CM | POA: Diagnosis present

## 2017-10-27 DIAGNOSIS — D631 Anemia in chronic kidney disease: Secondary | ICD-10-CM | POA: Diagnosis present

## 2017-10-27 DIAGNOSIS — I10 Essential (primary) hypertension: Secondary | ICD-10-CM | POA: Diagnosis not present

## 2017-10-27 DIAGNOSIS — E1122 Type 2 diabetes mellitus with diabetic chronic kidney disease: Secondary | ICD-10-CM | POA: Diagnosis present

## 2017-10-27 DIAGNOSIS — Z79899 Other long term (current) drug therapy: Secondary | ICD-10-CM | POA: Diagnosis not present

## 2017-10-27 DIAGNOSIS — R791 Abnormal coagulation profile: Secondary | ICD-10-CM | POA: Diagnosis present

## 2017-10-27 DIAGNOSIS — Z86718 Personal history of other venous thrombosis and embolism: Secondary | ICD-10-CM | POA: Diagnosis not present

## 2017-10-27 DIAGNOSIS — M797 Fibromyalgia: Secondary | ICD-10-CM | POA: Diagnosis present

## 2017-10-27 DIAGNOSIS — I639 Cerebral infarction, unspecified: Secondary | ICD-10-CM | POA: Diagnosis not present

## 2017-10-27 DIAGNOSIS — I2510 Atherosclerotic heart disease of native coronary artery without angina pectoris: Secondary | ICD-10-CM | POA: Diagnosis present

## 2017-10-27 DIAGNOSIS — R4182 Altered mental status, unspecified: Secondary | ICD-10-CM | POA: Diagnosis not present

## 2017-10-27 DIAGNOSIS — N1330 Unspecified hydronephrosis: Secondary | ICD-10-CM | POA: Diagnosis present

## 2017-10-27 DIAGNOSIS — E11649 Type 2 diabetes mellitus with hypoglycemia without coma: Secondary | ICD-10-CM | POA: Diagnosis not present

## 2017-10-27 DIAGNOSIS — F0390 Unspecified dementia without behavioral disturbance: Secondary | ICD-10-CM | POA: Diagnosis present

## 2017-10-27 DIAGNOSIS — N179 Acute kidney failure, unspecified: Secondary | ICD-10-CM | POA: Diagnosis present

## 2017-10-27 DIAGNOSIS — J9600 Acute respiratory failure, unspecified whether with hypoxia or hypercapnia: Secondary | ICD-10-CM

## 2017-10-27 DIAGNOSIS — G9341 Metabolic encephalopathy: Secondary | ICD-10-CM | POA: Diagnosis present

## 2017-10-27 HISTORY — DX: Migraine, unspecified, not intractable, without status migrainosus: G43.909

## 2017-10-27 HISTORY — DX: Unspecified convulsions: R56.9

## 2017-10-27 HISTORY — DX: Low back pain: M54.5

## 2017-10-27 HISTORY — DX: Fibromyalgia: M79.7

## 2017-10-27 HISTORY — DX: Personal history of urinary calculi: Z87.442

## 2017-10-27 HISTORY — DX: Thyrotoxicosis, unspecified without thyrotoxic crisis or storm: E05.90

## 2017-10-27 HISTORY — DX: Low back pain, unspecified: M54.50

## 2017-10-27 HISTORY — DX: Pneumonia, unspecified organism: J18.9

## 2017-10-27 HISTORY — DX: Acute embolism and thrombosis of unspecified deep veins of unspecified lower extremity: I82.409

## 2017-10-27 HISTORY — DX: Type 2 diabetes mellitus without complications: E11.9

## 2017-10-27 HISTORY — DX: Major depressive disorder, single episode, unspecified: F32.9

## 2017-10-27 HISTORY — DX: Other chronic pain: G89.29

## 2017-10-27 HISTORY — DX: Chronic kidney disease, unspecified: N18.9

## 2017-10-27 HISTORY — DX: Essential (primary) hypertension: I10

## 2017-10-27 HISTORY — DX: Transient cerebral ischemic attack, unspecified: G45.9

## 2017-10-27 HISTORY — DX: Cerebral infarction, unspecified: I63.9

## 2017-10-27 HISTORY — DX: Depression, unspecified: F32.A

## 2017-10-27 LAB — CBC
HEMATOCRIT: 28.7 % — AB (ref 36.0–46.0)
Hemoglobin: 9 g/dL — ABNORMAL LOW (ref 12.0–15.0)
MCH: 27.8 pg (ref 26.0–34.0)
MCHC: 31.4 g/dL (ref 30.0–36.0)
MCV: 88.6 fL (ref 78.0–100.0)
PLATELETS: 292 10*3/uL (ref 150–400)
RBC: 3.24 MIL/uL — AB (ref 3.87–5.11)
RDW: 14.4 % (ref 11.5–15.5)
WBC: 9.3 10*3/uL (ref 4.0–10.5)

## 2017-10-27 LAB — COMPREHENSIVE METABOLIC PANEL
ALK PHOS: 65 U/L (ref 38–126)
ALT: 29 U/L (ref 14–54)
AST: 22 U/L (ref 15–41)
Albumin: 2.7 g/dL — ABNORMAL LOW (ref 3.5–5.0)
Anion gap: 15 (ref 5–15)
BILIRUBIN TOTAL: 0.5 mg/dL (ref 0.3–1.2)
BUN: 93 mg/dL — AB (ref 6–20)
CALCIUM: 7.8 mg/dL — AB (ref 8.9–10.3)
CHLORIDE: 96 mmol/L — AB (ref 101–111)
CO2: 25 mmol/L (ref 22–32)
CREATININE: 5.58 mg/dL — AB (ref 0.44–1.00)
GFR, EST AFRICAN AMERICAN: 8 mL/min — AB (ref >60)
GFR, EST NON AFRICAN AMERICAN: 7 mL/min — AB (ref >60)
Glucose, Bld: 185 mg/dL — ABNORMAL HIGH (ref 65–99)
Potassium: 5.6 mmol/L — ABNORMAL HIGH (ref 3.5–5.1)
Sodium: 136 mmol/L (ref 135–145)
Total Protein: 5.5 g/dL — ABNORMAL LOW (ref 6.5–8.1)

## 2017-10-27 LAB — GLUCOSE, CAPILLARY
Glucose-Capillary: 182 mg/dL — ABNORMAL HIGH (ref 65–99)
Glucose-Capillary: 165 mg/dL — ABNORMAL HIGH (ref 65–99)

## 2017-10-27 LAB — PROTIME-INR
INR: 4.46
PROTHROMBIN TIME: 42.1 s — AB (ref 11.4–15.2)

## 2017-10-27 LAB — APTT: APTT: 44 s — AB (ref 24–36)

## 2017-10-27 MED ORDER — THIAMINE HCL 100 MG/ML IJ SOLN
100.0000 mg | Freq: Every day | INTRAMUSCULAR | Status: DC
  Administered 2017-10-28 – 2017-10-30 (×3): 100 mg via INTRAVENOUS
  Filled 2017-10-27 (×3): qty 2

## 2017-10-27 MED ORDER — STROKE: EARLY STAGES OF RECOVERY BOOK
Freq: Once | Status: AC
  Administered 2017-10-27: 22:00:00
  Filled 2017-10-27: qty 1

## 2017-10-27 MED ORDER — ACETAMINOPHEN 160 MG/5ML PO SOLN: 650.0000 mg | ORAL | Status: DC | PRN

## 2017-10-27 MED ORDER — LEVOTHYROXINE SODIUM 100 MCG IV SOLR: 30.0000 ug | Freq: Every day | INTRAVENOUS | Status: DC

## 2017-10-27 MED ORDER — ASPIRIN 300 MG RE SUPP
300.0000 mg | Freq: Every day | RECTAL | Status: DC
  Administered 2017-10-28 – 2017-10-30 (×3): 300 mg via RECTAL
  Filled 2017-10-27 (×3): qty 1

## 2017-10-27 MED ORDER — IPRATROPIUM-ALBUTEROL 0.5-2.5 (3) MG/3ML IN SOLN: 3.0000 mL | RESPIRATORY_TRACT | Status: DC | PRN

## 2017-10-27 MED ORDER — ACETAMINOPHEN 325 MG PO TABS: 650.0000 mg | ORAL_TABLET | ORAL | Status: DC | PRN

## 2017-10-27 MED ORDER — INSULIN ASPART 100 UNIT/ML ~~LOC~~ SOLN
0.0000 [IU] | SUBCUTANEOUS | Status: DC
  Administered 2017-10-29: 5 [IU] via SUBCUTANEOUS
  Administered 2017-10-29 (×2): 1 [IU] via SUBCUTANEOUS
  Administered 2017-10-29: 9 [IU] via SUBCUTANEOUS
  Administered 2017-10-29: 1 [IU] via SUBCUTANEOUS
  Administered 2017-10-30: 3 [IU] via SUBCUTANEOUS
  Administered 2017-10-30: 1 [IU] via SUBCUTANEOUS

## 2017-10-27 MED ORDER — HYDROCORTISONE NA SUCCINATE PF 100 MG IJ SOLR
50.0000 mg | Freq: Three times a day (TID) | INTRAMUSCULAR | Status: DC
  Administered 2017-10-27 – 2017-10-30 (×9): 50 mg via INTRAVENOUS
  Filled 2017-10-27 (×9): qty 2

## 2017-10-27 MED ORDER — ACETAMINOPHEN 650 MG RE SUPP: 650.0000 mg | RECTAL | Status: DC | PRN

## 2017-10-27 NOTE — Consult Note (Signed)
Reason for Consult:Renal Failure Referring Physician: Dr. Fuller Plan  Chief Complaint: Altered mental status  Assessment/Plan: 1. Acute on chronic renal failure - appears to be in ATN now with very questionable obstruction which if unilateral as the ultrasound suggests doesn't explain the acute component. May be secondary to volume status as she was dependent on her spouse to feed her but now that she is in florid renal failure she is volume expanded with  poor response to high dose diuretics. - she has an advance directive declining aggressive measures if she is in a vegetative state or as the family states there is poor prognosis for meaningful recovery. - It's reasonable to proceed with the MRI and if it shows a very significant or large CVA with unlikely meaningful recovery then dialysis will not to be offered. - Otherwise we may attempt dialysis for 3-5 treatments to rule out uremia as a cuase of the AMS. 2. Hyperkalemia - Kayexalate via NGT as tolerated, Insulin + D50, CaGluconate per protocol but definitive treatment will be dialysis. 3. ?CVA - awaiting MRI 4. Diastolic CHF - will need dialysis given poor response to diuretics 5. H/o CVA, DVT + PE 6. Hypothyroidism 7. DM 8. HTN    HPI: Kathryn Howe is an 77 y.o. 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 She has residual right sided weakness since the CVA and dropped her fork Sat and also took much longer to assist to the restroom at home. Later that night she called for her husband (they sleep in separate beds) and her speech was incoherent which led the husband to call EMS. At [facility] non contrast CT was suspicious for a CVA in the left frontal cortex but not certain and a MRI was recommended. She was also noted have  adrenal insufficiency and started on hydrocortisone. She was also noted to have worsening renal function, had a foley placed + gentle hydration with no improvement. U/S showed possible mild right hydronephrosis but no hydro on the left side.  She was given high dose diuretics + kayexalate with poor response and she has been basically anuric.  ROS Pertinent items are noted in HPI.  Chemistry and CBC: Creatinine, Ser  Date/Time Value Ref Range Status  10/27/2017 08:48 PM 5.58 (H) 0.44 - 1.00 mg/dL Final   Recent Labs  Lab 10/27/17 2048  NA 136  K 5.6*  CL 96*  CO2 25  GLUCOSE 185*  BUN 93*  CREATININE 5.58*  CALCIUM 7.8*   Recent Labs  Lab 10/27/17 2048  WBC 9.3  HGB 9.0*  HCT 28.7*  MCV 88.6  PLT 292   Liver Function Tests: Recent Labs  Lab 10/27/17 2048  AST 22  ALT 29  ALKPHOS 65  BILITOT 0.5  PROT 5.5*  ALBUMIN 2.7*   No results for input(s): LIPASE, AMYLASE in the last 168 hours. No results for input(s): AMMONIA in the last 168 hours. Cardiac Enzymes: No results for input(s): CKTOTAL, CKMB, CKMBINDEX, TROPONINI in the last 168 hours. Iron Studies: No results for input(s): IRON, TIBC, TRANSFERRIN, FERRITIN in the last 72 hours. PT/INR: @LABRCNTIP (inr:5)  Xrays/Other Studies: ) Results for orders placed or performed during the hospital encounter of 10/27/17 (from the past 48 hour(s))  Glucose, capillary     Status: Abnormal   Collection Time: 10/27/17  8:02 PM  Result Value Ref Range   Glucose-Capillary 182 (H) 65 - 99 mg/dL  CBC     Status: Abnormal   Collection Time: 10/27/17  8:48 PM  Result Value Ref Range   WBC 9.3 4.0 - 10.5 K/uL   RBC 3.24 (L) 3.87 - 5.11 MIL/uL   Hemoglobin 9.0 (L) 12.0 - 15.0 g/dL   HCT 28.7 (L) 36.0 - 46.0 %   MCV 88.6 78.0 - 100.0 fL   MCH 27.8 26.0 - 34.0 pg   MCHC 31.4 30.0 - 36.0 g/dL   RDW 14.4 11.5 - 15.5 %   Platelets 292 150 - 400 K/uL    Comment: Performed at [facility], [place] [address],  [place], [place] [address]  Comprehensive metabolic panel     Status: Abnormal   Collection Time: 10/27/17  8:48 PM  Result Value Ref Range   Sodium 136 135 - 145 mmol/L   Potassium 5.6 (H) 3.5 - 5.1 mmol/L   Chloride 96 (L) 101 - 111 mmol/L   CO2 25 22 - 32 mmol/L   Glucose, Bld 185 (H) 65 - 99 mg/dL   BUN 93 (H) 6 - 20 mg/dL   Creatinine, Ser 5.58 (H) 0.44 - 1.00 mg/dL   Calcium 7.8 (L) 8.9 - 10.3 mg/dL   Total Protein 5.5 (L) 6.5 - 8.1 g/dL   Albumin 2.7 (L) 3.5 - 5.0 g/dL   AST 22 15 - 41 U/L   ALT 29 14 - 54 U/L   Alkaline Phosphatase 65 38 - 126 U/L   Total Bilirubin 0.5 0.3 - 1.2 mg/dL   GFR calc non Af Amer 7 (L) >60 mL/min   GFR calc Af Amer 8 (L) >60 mL/min    Comment: (NOTE) The eGFR has been calculated using the CKD EPI equation. This calculation has not been validated in all clinical situations. eGFR's persistently <60 mL/min signify possible Chronic Kidney Disease.    Anion gap 15 5 - 15    Comment: Performed at [facility] [address], [place], [place] [address]  APTT     Status: Abnormal   Collection Time: 10/27/17  8:48 PM  Result Value Ref Range   aPTT 44 (H) 24 - 36 seconds    Comment:        IF BASELINE aPTT IS ELEVATED, SUGGEST PATIENT RISK ASSESSMENT BE USED TO DETERMINE APPROPRIATE ANTICOAGULANT THERAPY. Performed at [facility], [place] [address] [place], [place] [address]   Protime-INR     Status: Abnormal   Collection Time: 10/27/17  8:48 PM  Result Value Ref Range   Prothrombin Time 42.1 (H) 11.4 - 15.2 seconds   INR 4.46 (HH)     Comment: REPEATED TO VERIFY CRITICAL RESULT CALLED TO, READ BACK BY AND VERIFIED WITH: C COBLE,RN 2147 10/27/2017 WBOND Performed at [facility], [place] [address] [place], [place] [address]    No results found.  PMH:   Past Medical History:  Diagnosis Date  . Chronic kidney disease    "don't know stage" (10/27/2017)  . Chronic lower back pain   . Depression   . DVT (deep venous thrombosis)  ([place]) 2016   LLE  . Fibromyalgia   . History of kidney stones    "passed it"  . Hypertension   . Hyperthyroidism   . Migraine    "none in years" (10/27/2017)  . Pneumonia    "several times" (10/27/2017)  . Seizures ([place])    "  when she was a small child" (10/27/2017)  . Stroke [facility]) 2015   "generalized weakness; , equilibrium effected real bad; memory issues since" (10/27/2017)  . TIA (transient ischemic attack)    "several; ongoing" (10/27/2017)  . Type II diabetes mellitus (HCC)     PSH:   Past Surgical History:  Procedure Laterality Date  . CARPAL TUNNEL RELEASE Right   . CHOLECYSTECTOMY OPEN    . CORONARY ANGIOPLASTY  2015   "resulted in a stroke"  . DILATION AND CURETTAGE OF UTERUS    . HEMORRHOID SURGERY    . HERNIA REPAIR    . TOTAL ABDOMINAL HYSTERECTOMY    . UMBILICAL HERNIA REPAIR      Allergies:  Allergies  Allergen Reactions  . Clindamycin/Lincomycin     "goes wild" (Clindamycin)  . Morphine And Related     Possibly caused mental status changes    Medications:   Prior to Admission medications   Medication Sig Start Date End Date Taking? Authorizing Provider  citalopram (CELEXA) 40 MG tablet Take 40 mg by mouth at bedtime.   Yes [provider]  DULoxetine (CYMBALTA) 60 MG capsule Take 60 mg by mouth daily.   Yes [provider]  glipiZIDE (GLUCOTROL) 10 MG tablet Take 20 mg by mouth daily before breakfast.   Yes [provider]  HYDROXYZINE HCL PO Take by mouth 2 (two) times daily as needed (itching).   Yes [provider]  insulin glargine (LANTUS) 100 UNIT/ML injection Inject 10 Units into the skin daily.   Yes [provider]  losartan (COZAAR) 50 MG tablet Take 50 mg by mouth daily.   Yes [provider]  PIOGLITAZONE HCL PO Take by mouth daily.   Yes [provider]  pravastatin (PRAVACHOL) 20 MG tablet Take 20 mg by mouth every evening.   Yes [provider]  thyroid (ARMOUR) 60 MG  tablet Take 60 mg by mouth daily before breakfast.   Yes [provider]  torsemide (DEMADEX) 20 MG tablet Take 40 mg by mouth daily.   Yes [provider]  warfarin (COUMADIN) 4 MG tablet Take 4 mg by mouth daily at 6 PM.   Yes [provider]    Discontinued Meds:  There are no discontinued medications.  Social History:  reports that she has never smoked. She has never used smokeless tobacco. She reports that she drinks about 4.2 oz of alcohol per week. She reports that she does not use drugs.  Family History:  History reviewed. No pertinent family history.  Blood pressure (!) 116/53, pulse 80, temperature 98.5 F (36.9 C), temperature source Axillary, resp. rate 18, weight 125.5 kg (276 lb 10.8 oz), SpO2 100 %. General appearance: mild distress and slowed mentation Head: Normocephalic, without obvious abnormality, atraumatic Eyes: negative Neck: no adenopathy, no carotid bruit, supple, symmetrical, trachea midline and thyroid not enlarged, symmetric, no tenderness/mass/nodules Back: symmetric, no curvature. ROM normal. No CVA tenderness. Resp: diminished breath sounds bibasilar and bilaterally Chest wall: no tenderness Cardio: regular rate and rhythm, S1, S2 normal, no murmur, click, rub or gallop GI: soft, non-tender; bowel sounds normal; no masses,  no organomegaly Extremities: edema 2+ Pulses: 2+ and symmetric Skin: Skin color, texture, turgor normal. No rashes or lesions Lymph nodes: Cervical, supraclavicular, and axillary nodes normal. Neurologic: Mental status: alertness: obtunded       Dwana Melena, MD 10/27/2017, 10:56 PM

## 2017-10-27 NOTE — H&P (Addendum)
History and Physical    Kathryn Howe ZOX:[id number] DOB: 10/24/1940 DOA: 10/27/2017  Referring MD/NP/PA: Dr. Jonah Blue PCP: [facility]  Patient coming from: Transfer from [place]   Chief Complaint: Altered mental status  I have personally briefly reviewed patient's old medical records in [facility]   HPI: Kathryn Howe is a 77 y.o. female with medical history significant of IDDM, chronic diastolic CHF, CVA with residual gait disturbance, PE/DVT on Coumadin, CKD stage III, OA, and obesity; who initially presented to [facility] on 4/20 with complaints of right-sided weakness and slurred speech.  The patient's husband helps provide additional history as the patient is unable to at this time.  At baseline the patient lives at home with her husband and needs assistance with all ADLs.  The day prior the patient had difficulty getting to the restroom and he noted some right-sided weakness.  Dinner patient had difficulty swallowing which is not unusual for her.  When the patient's husband went to check on her the following morning to see if she needed to use the restroom patient's speech was significantly slurred and she sounded as though she were babbling which was unusual for her.  He called EMS and the patient was brought to the hospital.  The patient's husband noted a recent change in medications from oxycodone to MS Contin prior to patient's hospitalization.  See assessment and plan for treatments performed at outside facility.  ED Course: As seen above   Review of Systems  Unable to perform ROS: Mental status change    Past Medical History:  Diagnosis Date  . Chronic kidney disease    "don't know stage" (10/27/2017)  . Chronic lower back pain   . Depression   . DVT (deep venous thrombosis) (HCC) 2016   LLE  . Fibromyalgia   . History of kidney stones    "passed it"  . Hypertension   . Hyperthyroidism   . Migraine    "none in years"  (10/27/2017)  . Pneumonia    "several times" (10/27/2017)  . Seizures (HCC)    "when she was a small child" (10/27/2017)  . Stroke [facility]) 2015   "generalized weakness; , equilibrium effected real bad; memory issues since" (10/27/2017)  . TIA (transient ischemic attack)    "several; ongoing" (10/27/2017)  . Type II diabetes mellitus (HCC)     Past Surgical History:  Procedure Laterality Date  . CARPAL TUNNEL RELEASE Right   . CHOLECYSTECTOMY OPEN    . CORONARY ANGIOPLASTY  2015   "resulted in a stroke"  . DILATION AND CURETTAGE OF UTERUS    . HEMORRHOID SURGERY    . HERNIA REPAIR    . TOTAL ABDOMINAL HYSTERECTOMY    . UMBILICAL HERNIA REPAIR       reports that she has never smoked. She has never used smokeless tobacco. She reports that she drinks about 4.2 oz of alcohol per week. She reports that she does not use drugs.  Allergies  Allergen Reactions  . Clindamycin/Lincomycin     "goes wild" (Clindamycin)  . Morphine And Related     Possibly caused mental status changes    History reviewed. No pertinent family history.  Prior to Admission medications   Not on File    Physical Exam:  Constitutional: Obese female who is lethargic and not follow verbal commands. Vitals:   10/27/17 1719  BP: (!) 116/53  Pulse: 80  Resp: 18  Temp: 98.3 F (36.8 C)  TempSrc: Oral  SpO2:  100%  Weight: 125.5 kg (276 lb 10.8 oz)   Eyes: PERRL, lids and conjunctivae normal ENMT: Mucous membranes are dry posterior pharynx clear of any exudate or lesions.  Neck: normal, supple, no masses, no thyromegaly Respiratory: Decreased overall aeration but no significant wheezes or rhonchi appreciated. Cardiovascular: Regular rate and rhythm, no murmurs / rubs / gallops.  3+ pitting lower extremity edema. 2+ pedal pulses. No carotid bruits.  Abdomen: no tenderness, no masses palpated. No hepatosplenomegaly. Bowel sounds positive.  Musculoskeletal: no clubbing / cyanosis. No joint deformity upper and  lower extremities. Good ROM, no contractures. Normal muscle tone.  Skin: no rashes, lesions, ulcers. No induration Neurologic: Patient will move extremities to noxious stimuli but cannot formally test strength. Psychiatric: Unable to assess at this time    Labs on Admission: I have personally reviewed following labs and imaging studies  CBC: No results for input(s): WBC, NEUTROABS, HGB, HCT, MCV, PLT in the last 168 hours. Basic Metabolic Panel: No results for input(s): NA, K, CL, CO2, GLUCOSE, BUN, CREATININE, CALCIUM, MG, PHOS in the last 168 hours. GFR: CrCl cannot be calculated (No order found.). Liver Function Tests: No results for input(s): AST, ALT, ALKPHOS, BILITOT, PROT, ALBUMIN in the last 168 hours. No results for input(s): LIPASE, AMYLASE in the last 168 hours. No results for input(s): AMMONIA in the last 168 hours. Coagulation Profile: No results for input(s): INR, PROTIME in the last 168 hours. Cardiac Enzymes: No results for input(s): CKTOTAL, CKMB, CKMBINDEX, TROPONINI in the last 168 hours. BNP (last 3 results) No results for input(s): PROBNP in the last 8760 hours. HbA1C: No results for input(s): HGBA1C in the last 72 hours. CBG: No results for input(s): GLUCAP in the last 168 hours. Lipid Profile: No results for input(s): CHOL, HDL, LDLCALC, TRIG, CHOLHDL, LDLDIRECT in the last 72 hours. Thyroid Function Tests: No results for input(s): TSH, T4TOTAL, FREET4, T3FREE, THYROIDAB in the last 72 hours. Anemia Panel: No results for input(s): VITAMINB12, FOLATE, FERRITIN, TIBC, IRON, RETICCTPCT in the last 72 hours. Urine analysis: No results found for: COLORURINE, APPEARANCEUR, LABSPEC, PHURINE, GLUCOSEU, HGBUR, BILIRUBINUR, KETONESUR, PROTEINUR, UROBILINOGEN, NITRITE, LEUKOCYTESUR Sepsis Labs: No results found for this or any previous visit (from the past 240 hour(s)).   Radiological Exams on Admission:   EKG: Independently reviewed.  Normal sinus rhythm 86 bpm  with incomplete RBBB  Assessment/Plan Acute metabolic encephalopathy: Patient's mental status has waxed and waned throughout her hospital course.  Suspecting likely multifactorial in nature  considering acute renal failure with elevated BUN, suspect stroke, vs other.  Patient had previously been evaluated with EEG that showed no significant abnormalities prior to transport patient currently not on any narcotic medications although had been given Haldol for agitation. - Admit to stepdown bed - Neurochecks - N.p.o.  - Aspiration precautions  Right-sided weakness, suspected acute CVA: Patient had initial CT performed on 4/20 showing possible right frontal ischemia, but repeat CT on 4/21 did not show any evidence of infarction.  LDL noted to be 54.  An outside hospital when patient was more alert she had been on a modified diet with honey thickened liquids. - Check MRI MRA of the brain - Speech therapy to see in a.m. - PT/OT on hold until more alert - ASA suppositories daily for now - Discussed with Dr. Otelia LimesLindzen neurology, formal consult pending MRI results if positive  Acute renal failure: Patient presents with a creatinine of 5.58 and BUN 96.  Patient urine output has been minimal despite trial  of high-dose Lasix.  Renal ultrasound revealed possible mild right hydronephrosis on 4/21.  Given patient's decreased overall urine output and worsening kidney function suspect patient may need dialysis. - Continue Foley catheter for accurate I&O's - Dr. Juel Burrow of nephrology consulted, and will follow-up for further recommendations  Suspected adrenal insufficiency: Patient was noted to have suboptimal response to ACTH stimulation test cortisol levels went from 5.7 at baseline to 7.6 at 30-minutes and 10 at 60-minutes after she was given corticotropin. - Continue hydrocortisone 50 mg IV every 8  Hyperkalemia: Initial potassium noted to be 5.6 on admission.  Patient had been given 60 mg of Kayexalate earlier  today. - Recheck BMP in a.m.  Essential hypertension: Patient has been taking losartan at home with this is been held due to kidney function. Blood pressures appear stable on arrival. - Continue to monitor  Chronic diastolic CHF: Patient was taking furosemide at home, but this was held due to kidney function.  Echocardiogram revealed EF of 55-60% with abnormal relaxation. - Strict I&O's and daily weights  History of DVT with supratherapeutic INR: Acute.  INR noted to be greater than 4 on admission. - Hold anticoagulation - Recheck PT/INR in a.m.  Diabetes mellitus type 2: Patient had been on 10 units of Lantus at night  at home, but during hospitalization at outside facility noted to be hypoglycemic switch to sliding scale insulin.  Globin A1c was noted to be 7.6. - Hypoglycemic protocols - CBGs every 4 hours with custom SSI  Hypothyroidism: TSH at outside facility noted to be 1.8 with free T4 1.5. - Continue levothyroxine IV   Morbid obesity: BMI previously noted to be around 54.7   DVT prophylaxis: None   Code Status: DNR Family Communication: Skills plan of care with the patient and family present at bedside Disposition Plan: TBD  Consults called: Neurology Admission status: Inpatient  Clydie Braun MD [facility] Pager [phone]   If 7PM-7AM, please contact night-coverage [email or web address] Password [facility]  10/27/2017, 7:32 PM

## 2017-10-28 ENCOUNTER — Inpatient Hospital Stay (HOSPITAL_COMMUNITY): Payer: No Typology Code available for payment source

## 2017-10-28 DIAGNOSIS — I10 Essential (primary) hypertension: Secondary | ICD-10-CM | POA: Diagnosis present

## 2017-10-28 DIAGNOSIS — R791 Abnormal coagulation profile: Secondary | ICD-10-CM | POA: Diagnosis present

## 2017-10-28 DIAGNOSIS — R531 Weakness: Secondary | ICD-10-CM

## 2017-10-28 DIAGNOSIS — Z86718 Personal history of other venous thrombosis and embolism: Secondary | ICD-10-CM

## 2017-10-28 DIAGNOSIS — E271 Primary adrenocortical insufficiency: Secondary | ICD-10-CM | POA: Diagnosis present

## 2017-10-28 DIAGNOSIS — N179 Acute kidney failure, unspecified: Secondary | ICD-10-CM | POA: Diagnosis present

## 2017-10-28 DIAGNOSIS — E875 Hyperkalemia: Secondary | ICD-10-CM | POA: Diagnosis present

## 2017-10-28 LAB — BLOOD GAS, ARTERIAL
ACID-BASE DEFICIT: 0.9 mmol/L (ref 0.0–2.0)
BICARBONATE: 25.5 mmol/L (ref 20.0–28.0)
DRAWN BY: 244851
O2 CONTENT: 2 L/min
O2 Saturation: 94.9 %
Patient temperature: 98.2
pCO2 arterial: 59.7 mmHg — ABNORMAL HIGH (ref 32.0–48.0)
pH, Arterial: 7.252 — ABNORMAL LOW (ref 7.350–7.450)
pO2, Arterial: 81.3 mmHg — ABNORMAL LOW (ref 83.0–108.0)

## 2017-10-28 LAB — CBC WITH DIFFERENTIAL/PLATELET
BASOS ABS: 0 10*3/uL (ref 0.0–0.1)
Basophils Relative: 0 %
EOS ABS: 0 10*3/uL (ref 0.0–0.7)
Eosinophils Relative: 0 %
HCT: 28.7 % — ABNORMAL LOW (ref 36.0–46.0)
Hemoglobin: 8.8 g/dL — ABNORMAL LOW (ref 12.0–15.0)
Lymphocytes Relative: 11 %
Lymphs Abs: 1 10*3/uL (ref 0.7–4.0)
MCH: 27.2 pg (ref 26.0–34.0)
MCHC: 30.7 g/dL (ref 30.0–36.0)
MCV: 88.9 fL (ref 78.0–100.0)
MONO ABS: 0.9 10*3/uL (ref 0.1–1.0)
MONOS PCT: 10 %
NEUTROS ABS: 7 10*3/uL (ref 1.7–7.7)
Neutrophils Relative %: 79 %
PLATELETS: 316 10*3/uL (ref 150–400)
RBC: 3.23 MIL/uL — ABNORMAL LOW (ref 3.87–5.11)
RDW: 14.5 % (ref 11.5–15.5)
WBC: 8.9 10*3/uL (ref 4.0–10.5)

## 2017-10-28 LAB — RENAL FUNCTION PANEL
Albumin: 2.5 g/dL — ABNORMAL LOW (ref 3.5–5.0)
Anion gap: 16 — ABNORMAL HIGH (ref 5–15)
BUN: 90 mg/dL — ABNORMAL HIGH (ref 6–20)
CALCIUM: 7.8 mg/dL — AB (ref 8.9–10.3)
CHLORIDE: 97 mmol/L — AB (ref 101–111)
CO2: 25 mmol/L (ref 22–32)
CREATININE: 5.74 mg/dL — AB (ref 0.44–1.00)
GFR calc Af Amer: 8 mL/min — ABNORMAL LOW (ref >60)
GFR calc non Af Amer: 7 mL/min — ABNORMAL LOW (ref >60)
GLUCOSE: 160 mg/dL — AB (ref 65–99)
Phosphorus: 12.9 mg/dL — ABNORMAL HIGH (ref 2.5–4.6)
Potassium: 5.9 mmol/L — ABNORMAL HIGH (ref 3.5–5.1)
SODIUM: 138 mmol/L (ref 135–145)

## 2017-10-28 LAB — PROTIME-INR
INR: 4.49 — AB
Prothrombin Time: 42.4 seconds — ABNORMAL HIGH (ref 11.4–15.2)

## 2017-10-28 LAB — GLUCOSE, CAPILLARY
GLUCOSE-CAPILLARY: 168 mg/dL — AB (ref 65–99)
GLUCOSE-CAPILLARY: 164 mg/dL — AB (ref 65–99)
GLUCOSE-CAPILLARY: 133 mg/dL — AB (ref 65–99)
GLUCOSE-CAPILLARY: 160 mg/dL — AB (ref 65–99)

## 2017-10-28 LAB — MRSA PCR SCREENING: MRSA BY PCR: NEGATIVE

## 2017-10-28 MED ORDER — STROKE: EARLY STAGES OF RECOVERY BOOK
Freq: Once | Status: AC
  Administered 2017-10-28: 18:00:00
  Filled 2017-10-28: qty 1

## 2017-10-28 MED ORDER — LORAZEPAM 2 MG/ML IJ SOLN: 1.0000 mg | INTRAMUSCULAR | Status: DC | PRN

## 2017-10-28 MED ORDER — METOLAZONE 5 MG PO TABS
5.0000 mg | ORAL_TABLET | Freq: Every day | ORAL | Status: DC
  Administered 2017-10-28 – 2017-10-30 (×3): 5 mg via ORAL
  Filled 2017-10-28 (×5): qty 1

## 2017-10-28 MED ORDER — LEVOTHYROXINE SODIUM 100 MCG IV SOLR
50.0000 ug | Freq: Every day | INTRAVENOUS | Status: DC
  Administered 2017-10-29 – 2017-10-30 (×2): 50 ug via INTRAVENOUS
  Filled 2017-10-28 (×2): qty 5

## 2017-10-28 MED ORDER — FUROSEMIDE 10 MG/ML IJ SOLN
80.0000 mg | Freq: Three times a day (TID) | INTRAMUSCULAR | Status: DC
  Administered 2017-10-28 – 2017-10-30 (×7): 80 mg via INTRAVENOUS
  Filled 2017-10-28 (×7): qty 8

## 2017-10-28 NOTE — Progress Notes (Signed)
CRITICAL VALUE ALERT  Critical Value:  INR 4.46  Date & Time Notied:  10/28/17 at 2150  Provider Notified: Rondell Smith,MD  Orders Received/Actions taken: No new orders per MD. Continue to monitor and treat per MD orders.

## 2017-10-28 NOTE — Progress Notes (Signed)
[facility] PROGRESS NOTE  Kathryn Howe JXB:[id number] DOB: May 22, 1941 DOA: 10/27/2017 PCP: [facility]  Assessment/Plan:  Acute Encephalopathy Med related? Pt improving Still with multi organ system failure prognosis is poor Palliative consult Plan to do comfort care orders tomorrow  R side weakness MRI/MRA neg  ARF Comfort orders for HD from nephro, they agree with pallaitive approach  HTN Cont to monitor  CHF Monitor I&Os  DM SSI  Code Status: DNR Family Communication: DGTR POA (indicate person spoken with, relationship, and if by phone, the number) Disposition Plan: Hospice   Consultants:  Palliative  Renal  HPI/Subjective: More alert this AM. No acute events overnight. Spoke to dgtr POA who pressed for palliative care involvement.   Objective: Vitals:   10/28/17 1201 10/28/17 1635  BP:    Pulse:    Resp:    Temp: 97.8 F (36.6 C) 98.1 F (36.7 C)  SpO2:      Intake/Output Summary (Last 24 hours) at 10/28/2017 2149 Last data filed at 10/28/2017 0931 Gross per 24 hour  Intake -  Output 51 ml  Net -51 ml   Filed Weights   10/27/17 1719 10/28/17 0421  Weight: 125.5 kg (276 lb 10.8 oz) 123.6 kg (272 lb 7.8 oz)    Exam:  General: NCAT, NAD; anasarca Cardiovascular: RRR, no MRG Respiratory: CTAB, nl wob Abdomen: NS, BS+, NTTP Musculoskeletal: moving all extr, nl tone     Data Reviewed: Basic Metabolic Panel: Recent Labs  Lab 10/27/17 2048 10/28/17 0329  NA 136 138  K 5.6* 5.9*  CL 96* 97*  CO2 25 25  GLUCOSE 185* 160*  Howe 93* 90*  CREATININE 5.58* 5.74*  CALCIUM 7.8* 7.8*  PHOS  --  12.9*   Liver Function Tests: Recent Labs  Lab 10/27/17 2048 10/28/17 0329  AST 22  --   ALT 29  --   ALKPHOS 65  --   BILITOT 0.5  --   PROT 5.5*  --   ALBUMIN 2.7* 2.5*   No results for input(s): LIPASE, AMYLASE in the last 168 hours. No results for input(s): AMMONIA in the last 168 hours. CBC: Recent Labs   Lab 10/27/17 2048 10/28/17 0329  WBC 9.3 8.9  NEUTROABS  --  7.0  HGB 9.0* 8.8*  HCT 28.7* 28.7*  MCV 88.6 88.9  PLT 292 316   Cardiac Enzymes: No results for input(s): CKTOTAL, CKMB, CKMBINDEX, TROPONINI in the last 168 hours. BNP (last 3 results) No results for input(s): BNP in the last 8760 hours.  ProBNP (last 3 results) No results for input(s): PROBNP in the last 8760 hours.  CBG: Recent Labs  Lab 10/27/17 2002 10/27/17 2316 10/28/17 0418 10/28/17 1307 10/28/17 1700  GLUCAP 182* 165* 168* 164* 133*    Recent Results (from the past 240 hour(s))  MRSA PCR Screening     Status: None   Collection Time: 10/28/17  1:27 AM  Result Value Ref Range Status   MRSA by PCR NEGATIVE NEGATIVE Final    Comment:        The GeneXpert MRSA Assay (FDA approved for NASAL specimens only), is one component of a comprehensive MRSA colonization surveillance program. It is not intended to diagnose MRSA infection nor to guide or monitor treatment for MRSA infections. Performed at [facility], [address] [place], [place] [address]      Studies: Mr Brain 45 Contrast  Result Date: 10/28/2017 CLINICAL DATA:  Altered level consciousness, follow-up stroke. History of hypertension,  migraine, stroke and diabetes. EXAM: MRI HEAD WITHOUT CONTRAST MRA HEAD WITHOUT CONTRAST TECHNIQUE: Multiplanar, multiecho pulse sequences of the brain and surrounding structures were obtained without intravenous contrast. Angiographic images of the head were obtained using MRA technique without contrast. COMPARISON:  CT HEAD October 25, 2017. MRI of the head report January 31, 2014 though images are not available for direct comparison. FINDINGS: MRI HEAD FINDINGS-multiple sequences are moderately motion degraded. INTRACRANIAL CONTENTS: No reduced diffusion to suggest acute ischemia. No susceptibility artifact to suggest hemorrhage though motion decreases sensitivity for subtle hemosiderin staining  microhemorrhage. Patchy supratentorial white matter changes compatible with mild chronic small vessel ischemic disease, normal for age. The ventricles and sulci are normal for patient's age. No suspicious parenchymal signal, masses, mass effect. No abnormal extra-axial fluid collections. No extra-axial masses. VASCULAR: Normal major intracranial vascular flow voids present at skull base. SKULL AND UPPER CERVICAL SPINE: No abnormal sellar expansion. No suspicious calvarial bone marrow signal. Craniocervical junction maintained. SINUSES/ORBITS: LEFT mastoid effusion. Paranasal sinuses are well aerated.The included ocular globes and orbital contents are non-suspicious. OTHER: Patient is edentulous. MRA HEAD FINDINGS-moderate to severely motion degraded examination. ANTERIOR CIRCULATION: Normal flow related enhancement of the included cervical, petrous, cavernous and supraclinoid internal carotid arteries. Patent anterior communicating artery. Patent anterior and middle cerebral arteries. Bilateral anterior cerebral arteries arise from RIGHT A1-2 junction. POSTERIOR CIRCULATION: Codominant vertebral arteries. Basilar artery is patent, with normal flow related enhancement of the main branch vessels. Patent posterior cerebral arteries, potentially stenotic RIGHT P1 segment versus motion artifact. Robust RIGHT posterior communicating artery present. ANATOMIC VARIANTS: None. Source images and MIP images were reviewed. IMPRESSION: MRI head:Negative motion degraded noncontrast MRI of the head for age. MRA head: No emergent large vessel occlusion on this motion degraded examination. Electronically Signed   By: Awilda Metro M.D.   On: 10/28/2017 01:18   Dg Chest Port 1 View  Result Date: 10/28/2017 CLINICAL DATA:  Acute respiratory failure. EXAM: PORTABLE CHEST 1 VIEW COMPARISON:  Radiograph of October 24, 2017. FINDINGS: Stable cardiomediastinal silhouette. Mild central pulmonary vascular congestion is noted. No  pneumothorax or pleural effusion is noted. No consolidative process is noted. Bony thorax is unremarkable. IMPRESSION: Mild central pulmonary vascular congestion. Electronically Signed   By: Lupita Raider, M.D.   On: 10/28/2017 11:33   Mr Maxine Glenn Head Wo Contrast  Result Date: 10/28/2017 CLINICAL DATA:  Altered level consciousness, follow-up stroke. History of hypertension, migraine, stroke and diabetes. EXAM: MRI HEAD WITHOUT CONTRAST MRA HEAD WITHOUT CONTRAST TECHNIQUE: Multiplanar, multiecho pulse sequences of the brain and surrounding structures were obtained without intravenous contrast. Angiographic images of the head were obtained using MRA technique without contrast. COMPARISON:  CT HEAD October 25, 2017. MRI of the head report January 31, 2014 though images are not available for direct comparison. FINDINGS: MRI HEAD FINDINGS-multiple sequences are moderately motion degraded. INTRACRANIAL CONTENTS: No reduced diffusion to suggest acute ischemia. No susceptibility artifact to suggest hemorrhage though motion decreases sensitivity for subtle hemosiderin staining microhemorrhage. Patchy supratentorial white matter changes compatible with mild chronic small vessel ischemic disease, normal for age. The ventricles and sulci are normal for patient's age. No suspicious parenchymal signal, masses, mass effect. No abnormal extra-axial fluid collections. No extra-axial masses. VASCULAR: Normal major intracranial vascular flow voids present at skull base. SKULL AND UPPER CERVICAL SPINE: No abnormal sellar expansion. No suspicious calvarial bone marrow signal. Craniocervical junction maintained. SINUSES/ORBITS: LEFT mastoid effusion. Paranasal sinuses are well aerated.The included ocular globes and orbital contents are non-suspicious.  OTHER: Patient is edentulous. MRA HEAD FINDINGS-moderate to severely motion degraded examination. ANTERIOR CIRCULATION: Normal flow related enhancement of the included cervical, petrous,  cavernous and supraclinoid internal carotid arteries. Patent anterior communicating artery. Patent anterior and middle cerebral arteries. Bilateral anterior cerebral arteries arise from RIGHT A1-2 junction. POSTERIOR CIRCULATION: Codominant vertebral arteries. Basilar artery is patent, with normal flow related enhancement of the main branch vessels. Patent posterior cerebral arteries, potentially stenotic RIGHT P1 segment versus motion artifact. Robust RIGHT posterior communicating artery present. ANATOMIC VARIANTS: None. Source images and MIP images were reviewed. IMPRESSION: MRI head:Negative motion degraded noncontrast MRI of the head for age. MRA head: No emergent large vessel occlusion on this motion degraded examination. Electronically Signed   By: Awilda Metroourtnay  Bloomer M.D.   On: 10/28/2017 01:18    Scheduled Meds: . aspirin  300 mg Rectal Daily  . furosemide  80 mg Intravenous Q8H  . hydrocortisone sod succinate (SOLU-CORTEF) inj  50 mg Intravenous Q8H  . insulin aspart  0-9 Units Subcutaneous Q4H  . [START ON 10/29/2017] levothyroxine  50 mcg Intravenous Daily  . metolazone  5 mg Oral Daily  . thiamine injection  100 mg Intravenous Daily   Continuous Infusions:  Principal Problem:   Acute metabolic encephalopathy Active Problems:   Acute renal failure (ARF) (HCC)   Adrenal insufficiency (Addison's disease) (HCC)   Benign essential HTN   Hyperkalemia   Right sided weakness   History of DVT (deep vein thrombosis)   Supratherapeutic INR    Time spent: 30    Haydee SalterPhillip M Shannell Mikkelsen  [facility] Pager AMION. If 7PM-7AM, please contact night-coverage at [email or web address], password [email or web address] 10/28/2017, 9:49 PM  LOS: 1 day

## 2017-10-28 NOTE — Evaluation (Signed)
Clinical/Bedside Swallow Evaluation Patient Details  Name: Kathryn Howe MRN: [id number] Date of Birth: 06-25-1941  Today's Date: 10/28/2017 Time: SLP Start Time (ACUTE ONLY): 1520 SLP Stop Time (ACUTE ONLY): 1540 SLP Time Calculation (min) (ACUTE ONLY): 20 min  Past Medical History:  Past Medical History:  Diagnosis Date  . Chronic kidney disease    "don't know stage" (10/27/2017)  . Chronic lower back pain   . Depression   . DVT (deep venous thrombosis) (HCC) 2016   LLE  . Fibromyalgia   . History of kidney stones    "passed it"  . Hypertension   . Hyperthyroidism   . Migraine    "none in years" (10/27/2017)  . Pneumonia    "several times" (10/27/2017)  . Seizures (HCC)    "when she was a small child" (10/27/2017)  . Stroke [facility]) 2015   "generalized weakness; , equilibrium effected real bad; memory issues since" (10/27/2017)  . TIA (transient ischemic attack)    "several; ongoing" (10/27/2017)  . Type II diabetes mellitus (HCC)    Past Surgical History:  Past Surgical History:  Procedure Laterality Date  . CARPAL TUNNEL RELEASE Right   . CHOLECYSTECTOMY OPEN    . CORONARY ANGIOPLASTY  2015   "resulted in a stroke"  . DILATION AND CURETTAGE OF UTERUS    . HEMORRHOID SURGERY    . HERNIA REPAIR    . TOTAL ABDOMINAL HYSTERECTOMY    . UMBILICAL HERNIA REPAIR     HPI:  Kathryn Lautenschlegeris a 77 y.o.femalewith medical history significant ofIDDM,chronic diastolic CHF,CVA with residual gait disturbance,PE/DVT on Coumadin, CKD stage III,OA,and obesity;who initially presented to [facility] on 4/20 withcomplaints of right-sided weakness and slurred speech. The patient's husband helps provide additional history as the patient is unable to at this time. At baseline the patient livesat home with her husband and needs assistance with all ADLs.The day prior the patient had difficulty getting to the restroom and he noted some right-sided weakness. Dinner  patient had difficulty swallowing which is not unusual for her. When the patient's husband went to check on her the following morning to see if she needed to use the restroom patient'sspeech was significantly slurred and she sounded as though she were babbling which was unusual for her. He called EMS and the patient was brought to the hospital. The patient's husband noted a recent change in medications from oxycodone to MS Contin prior to patient's hospitalization. MRI head revealed negative motion degraded noncontrast MRI of the head for   Assessment / Plan / Recommendation Clinical Impression  Pt presents with moderate risk of aspiration given decreased mentation and diminished respiratory support. Pt is totally dependent on husband for care and as a resutl she is a feeder. Pt consumed 4 oz thin water via cup sips without overt s/s of aspiration and consumed applesauce with small bites off spoon. SLP administered small bolus of graham cracker with little effective lingual manipulation and in ability to follow directions to open mouth for oral checks. Despite Total A multimodal cues, pt was not able to effectively open mouth or manipulate graham cracker. She was resistant to liquid wash to aid in clearing cracker. Finally, it dissolved. Most conversative diet is dysphagia 1 with thin liquids via cup and medicine crushed in puree. ST to continue to follow.  SLP Visit Diagnosis: Dysphagia, unspecified (R13.10)    Aspiration Risk  Moderate aspiration risk    Diet Recommendation Dysphagia 1 (Puree);Thin liquid   Liquid Administration via: Cup Medication Administration:  Crushed with puree Supervision: Staff to assist with self feeding;Full supervision/cueing for compensatory strategies Compensations: Minimize environmental distractions;Slow rate;Small sips/bites Postural Changes: Seated upright at 90 degrees    Other  Recommendations Oral Care Recommendations: Oral care BID   Follow up  Recommendations Skilled Nursing facility      Frequency and Duration min 2x/week  2 weeks       Prognosis Prognosis for Safe Diet Advancement: Fair Barriers to Reach Goals: Cognitive deficits;Severity of deficits      Swallow Study   General Date of Onset: 10/27/17 HPI: Kathryn Glanceldora Lautenschlegeris a 77 y.o.femalewith medical history significant ofIDDM,chronic diastolic CHF,CVA with residual gait disturbance,PE/DVT on Coumadin, CKD stage III,OA,and obesity;[facility] withcomplaints of right-sided weakness and slurred speech. The patient's husband helps provide additional history as the patient is unable to at this time. At baseline the patient livesat home with her husband and needs assistance with all ADLs.The day prior the patient had difficulty getting to the restroom and he noted some right-sided weakness. Dinner patient had difficulty swallowing which is not unusual for her. When the patient's husband went to check on her the following morning to see if she needed to use the restroom patient'sspeech was significantly slurred and she sounded as though she were babbling which was unusual for her. He called EMS and the patient was brought to the hospital. The patient's husband noted a recent change in medications from oxycodone to MS Contin prior to patient's hospitalization. MRI head revealed negative motion degraded noncontrast MRI of the head for Type of Study: Bedside Swallow Evaluation Previous Swallow Assessment: none in chart Diet Prior to this Study: NPO Temperature Spikes Noted: No Respiratory Status: Nasal cannula History of Recent Intubation: No Behavior/Cognition: Alert Oral Cavity Assessment: Within Functional Limits Oral Care Completed by SLP: Recent completion by staff Oral Cavity - Dentition: Edentulous Vision: Impaired for self-feeding Self-Feeding Abilities: Total assist Patient Positioning: Upright in  bed Baseline Vocal Quality: Low vocal intensity Volitional Cough: Cognitively unable to elicit Volitional Swallow: Able to elicit    Oral/Motor/Sensory Function Overall Oral Motor/Sensory Function: Within functional limits   Ice Chips Ice chips: Within functional limits Presentation: Spoon   Thin Liquid Thin Liquid: Within functional limits Presentation: Cup;Spoon    Nectar Thick Nectar Thick Liquid: Not tested   Honey Thick Honey Thick Liquid: Not tested   Puree Puree: Within functional limits Presentation: Spoon   Solid   GO   Solid: Not tested        Kathryn Howe 10/28/2017,5:09 PM

## 2017-10-28 NOTE — Progress Notes (Signed)
CRITICAL VALUE ALERT  Critical Value:  INR 4.49  Date & Time Notied:  10/28/17 at 0548  Provider Notified: Rondell Smith,MD  Orders Received/Actions taken: No new orders at this time.

## 2017-10-28 NOTE — Evaluation (Signed)
Speech Language Pathology Evaluation Patient Details Name: Kathryn Howe MRN: [id number] DOB: [birth year] Today's Date: 10/28/2017 Time: 1540-1550 SLP Time Calculation (min) (ACUTE ONLY): 10 min  Problem List:  Patient Active Problem List   Diagnosis Date Noted  . Acute renal failure (ARF) (HCC) 10/28/2017  . Adrenal insufficiency (Addison's disease) (HCC) 10/28/2017  . Benign essential HTN 10/28/2017  . Hyperkalemia 10/28/2017  . Right sided weakness 10/28/2017  . History of DVT (deep vein thrombosis) 10/28/2017  . Supratherapeutic INR 10/28/2017  . Acute metabolic encephalopathy 10/27/2017   Past Medical History:  Past Medical History:  Diagnosis Date  . Chronic kidney disease    "don't know stage" (10/27/2017)  . Chronic lower back pain   . Depression   . DVT (deep venous thrombosis) (HCC) 2016   LLE  . Fibromyalgia   . History of kidney stones    "passed it"  . Hypertension   . Hyperthyroidism   . Migraine    "none in years" (10/27/2017)  . Pneumonia    "several times" (10/27/2017)  . Seizures (HCC)    "when she was a small child" (10/27/2017)  . Stroke [facility]) 2015   "generalized weakness; , equilibrium effected real bad; memory issues since" (10/27/2017)  . TIA (transient ischemic attack)    "several; ongoing" (10/27/2017)  . Type II diabetes mellitus (HCC)    Past Surgical History:  Past Surgical History:  Procedure Laterality Date  . CARPAL TUNNEL RELEASE Right   . CHOLECYSTECTOMY OPEN    . CORONARY ANGIOPLASTY  2015   "resulted in a stroke"  . DILATION AND CURETTAGE OF UTERUS    . HEMORRHOID SURGERY    . HERNIA REPAIR    . TOTAL ABDOMINAL HYSTERECTOMY    . UMBILICAL HERNIA REPAIR     HPI:  Kathryn Lautenschlegeris a 77 y.o.femalewith medical history significant ofIDDM,chronic diastolic CHF,CVA with residual gait disturbance,PE/DVT on Coumadin, CKD stage III,OA,and obesity;who initially presented [facility] on [facility] withcomplaints of  right-sided weakness and slurred speech. The patient's husband helps provide additional history as the patient is unable to at this time. At baseline the patient livesat home with her husband and needs assistance with all ADLs.The day prior the patient had difficulty getting to the restroom and he noted some right-sided weakness. Dinner patient had difficulty swallowing which is not unusual for her. When the patient's husband went to check on her the following morning to see if she needed to use the restroom patient'sspeech was significantly slurred and she sounded as though she were babbling which was unusual for her. He called EMS and the patient was brought to the hospital. The patient's husband noted a recent change in medications from oxycodone to MS Contin prior to patient's hospitalization. MRI head revealed negative motion degraded noncontrast MRI of the head for   Assessment / Plan / Recommendation Clinical Impression  Pt's cognitive status was difficult to determine and difficult to differentiate from baseline. At baseline, she is totally dependent physically on husband. Her speech was very "child-like" in referring to her husband and in referring to food "icy creamy" which appeared embraced by family. Pt with overall confusion and appearance of lethargy and distraction. ST to continue to follow through dysphagia therapy and provide ongoing diagnostic evaluation of mentation.     SLP Assessment  SLP Visit Diagnosis: Cognitive communication deficit (R41.841)    Follow Up Recommendations  Skilled Nursing facility    Frequency and Duration min 2x/week  2 weeks  SLP Evaluation Cognition  Overall Cognitive Status: Difficult to assess Orientation Level: Oriented to person;Oriented to place;Disoriented to time;Disoriented to situation                GO                    Kathryn Howe 10/28/2017, 5:17 PM

## 2017-10-28 NOTE — Progress Notes (Signed)
Patient's daughter Margaretha GlassingLoretta informed this RN that her mother is a DNR.

## 2017-10-28 NOTE — Progress Notes (Addendum)
[facility] NEPHROLOGY PROGRESS NOTE  Assessment/ Plan: Pt is a 77 y.o. yo female with diabetes, coronary artery disease, embolic is stroke, dementia, diastolic heart failure, DVT and PE on Coumadin, hypothyroidism, morbidly obesity and essentially bedbound, CKD stage III, was admitted at [facility] for altered mental status and renal insufficiency and was having concern of stroke.  Patient with worsening renal failure, hyperkalemia and anuric.  Transferred here for further evaluation.  Assessment/Plan:  #Acute on chronic kidney disease stage III likely ATN: Patient now with hyperkalemia, and worsening renal failure.  She is anuric for at least 3 days.  She is morbidly obese and bedbound.  I had a long discussion with the patient's husband, daughter and son from the video (facetime).  Given multiple comorbidities discussed above, dialysis is not going to increase the quality of life.  It will increase the complication.  Patient and family would like to pursue comfort care and possibly hospice.  Patient would not wanted to go to dialysis if this is not the long-term solution for her.  She has living will as well. -As per family's request, I order IV Lasix and metolazone to see if patient has increased urine output and help edema.  Palliative care was consulted for possible discussion about hospice care.  I have discussed this with primary team as well.    #Hyperkalemia: Received Kayexalate.  Adding diuretics.  #Altered mental status: MRI with no stroke.  It could be due to worsening renal failure and uremia.  Patient was alert awake and able to comprehend this morning.  Provide supportive care and comfort measures.  #Anemia likely due to chronic kidney disease: Hemoglobin 8.8.  Subjective: Seen and examined at bedside.  Patient's family members including husband, daughter at bedside.  Patient's son was on video.  Patient denies shortness of breath, chest pain.  She was alert  awake. Objective Vital signs in last 24 hours: Vitals:   10/28/17 0323 10/28/17 0421 10/28/17 0608 10/28/17 0747  BP: 124/61  127/68   Pulse: 87  84   Resp: 15  12   Temp: 98.7 F (37.1 C)  97.9 F (36.6 C) 98.2 F (36.8 C)  TempSrc: Axillary  Axillary Oral  SpO2: 98%  99%   Weight:  123.6 kg (272 lb 7.8 oz)     Weight change:   Intake/Output Summary (Last 24 hours) at 10/28/2017 1130 Last data filed at 10/28/2017 0931 Gross per 24 hour  Intake -  Output 51 ml  Net -51 ml       Labs: Basic Metabolic Panel: Recent Labs  Lab 10/27/17 2048 10/28/17 0329  NA 136 138  K 5.6* 5.9*  CL 96* 97*  CO2 25 25  GLUCOSE 185* 160*  BUN 93* 90*  CREATININE 5.58* 5.74*  CALCIUM 7.8* 7.8*  PHOS  --  12.9*   Liver Function Tests: Recent Labs  Lab 10/27/17 2048 10/28/17 0329  AST 22  --   ALT 29  --   ALKPHOS 65  --   BILITOT 0.5  --   PROT 5.5*  --   ALBUMIN 2.7* 2.5*   No results for input(s): LIPASE, AMYLASE in the last 168 hours. No results for input(s): AMMONIA in the last 168 hours. CBC: Recent Labs  Lab 10/27/17 2048 10/28/17 0329  WBC 9.3 8.9  NEUTROABS  --  7.0  HGB 9.0* 8.8*  HCT 28.7* 28.7*  MCV 88.6 88.9  PLT 292 316   Cardiac Enzymes: No results for input(s):  CKTOTAL, CKMB, CKMBINDEX, TROPONINI in the last 168 hours. CBG: Recent Labs  Lab 10/27/17 2002 10/27/17 2316 10/28/17 0418  GLUCAP 182* 165* 168*    Iron Studies: No results for input(s): IRON, TIBC, TRANSFERRIN, FERRITIN in the last 72 hours. Studies/Results: Mr Brain Wo Contrast  Result Date: 10/28/2017 CLINICAL DATA:  Altered level consciousness, follow-up stroke. History of hypertension, migraine, stroke and diabetes. EXAM: MRI HEAD WITHOUT CONTRAST MRA HEAD WITHOUT CONTRAST TECHNIQUE: Multiplanar, multiecho pulse sequences of the brain and surrounding structures were obtained without intravenous contrast. Angiographic images of the head were obtained using MRA technique without  contrast. COMPARISON:  CT HEAD October 25, 2017. MRI of the head report January 31, 2014 though images are not available for direct comparison. FINDINGS: MRI HEAD FINDINGS-multiple sequences are moderately motion degraded. INTRACRANIAL CONTENTS: No reduced diffusion to suggest acute ischemia. No susceptibility artifact to suggest hemorrhage though motion decreases sensitivity for subtle hemosiderin staining microhemorrhage. Patchy supratentorial white matter changes compatible with mild chronic small vessel ischemic disease, normal for age. The ventricles and sulci are normal for patient's age. No suspicious parenchymal signal, masses, mass effect. No abnormal extra-axial fluid collections. No extra-axial masses. VASCULAR: Normal major intracranial vascular flow voids present at skull base. SKULL AND UPPER CERVICAL SPINE: No abnormal sellar expansion. No suspicious calvarial bone marrow signal. Craniocervical junction maintained. SINUSES/ORBITS: LEFT mastoid effusion. Paranasal sinuses are well aerated.The included ocular globes and orbital contents are non-suspicious. OTHER: Patient is edentulous. MRA HEAD FINDINGS-moderate to severely motion degraded examination. ANTERIOR CIRCULATION: Normal flow related enhancement of the included cervical, petrous, cavernous and supraclinoid internal carotid arteries. Patent anterior communicating artery. Patent anterior and middle cerebral arteries. Bilateral anterior cerebral arteries arise from RIGHT A1-2 junction. POSTERIOR CIRCULATION: Codominant vertebral arteries. Basilar artery is patent, with normal flow related enhancement of the main branch vessels. Patent posterior cerebral arteries, potentially stenotic RIGHT P1 segment versus motion artifact. Robust RIGHT posterior communicating artery present. ANATOMIC VARIANTS: None. Source images and MIP images were reviewed. IMPRESSION: MRI head:Negative motion degraded noncontrast MRI of the head for age. MRA head: No emergent  large vessel occlusion on this motion degraded examination. Electronically Signed   By: Awilda Metroourtnay  Bloomer M.D.   On: 10/28/2017 01:18   Mr Maxine GlennMra Head Wo Contrast  Result Date: 10/28/2017 CLINICAL DATA:  Altered level consciousness, follow-up stroke. History of hypertension, migraine, stroke and diabetes. EXAM: MRI HEAD WITHOUT CONTRAST MRA HEAD WITHOUT CONTRAST TECHNIQUE: Multiplanar, multiecho pulse sequences of the brain and surrounding structures were obtained without intravenous contrast. Angiographic images of the head were obtained using MRA technique without contrast. COMPARISON:  CT HEAD October 25, 2017. MRI of the head report January 31, 2014 though images are not available for direct comparison. FINDINGS: MRI HEAD FINDINGS-multiple sequences are moderately motion degraded. INTRACRANIAL CONTENTS: No reduced diffusion to suggest acute ischemia. No susceptibility artifact to suggest hemorrhage though motion decreases sensitivity for subtle hemosiderin staining microhemorrhage. Patchy supratentorial white matter changes compatible with mild chronic small vessel ischemic disease, normal for age. The ventricles and sulci are normal for patient's age. No suspicious parenchymal signal, masses, mass effect. No abnormal extra-axial fluid collections. No extra-axial masses. VASCULAR: Normal major intracranial vascular flow voids present at skull base. SKULL AND UPPER CERVICAL SPINE: No abnormal sellar expansion. No suspicious calvarial bone marrow signal. Craniocervical junction maintained. SINUSES/ORBITS: LEFT mastoid effusion. Paranasal sinuses are well aerated.The included ocular globes and orbital contents are non-suspicious. OTHER: Patient is edentulous. MRA HEAD FINDINGS-moderate to severely motion degraded examination. ANTERIOR CIRCULATION:  Normal flow related enhancement of the included cervical, petrous, cavernous and supraclinoid internal carotid arteries. Patent anterior communicating artery. Patent anterior  and middle cerebral arteries. Bilateral anterior cerebral arteries arise from RIGHT A1-2 junction. POSTERIOR CIRCULATION: Codominant vertebral arteries. Basilar artery is patent, with normal flow related enhancement of the main branch vessels. Patent posterior cerebral arteries, potentially stenotic RIGHT P1 segment versus motion artifact. Robust RIGHT posterior communicating artery present. ANATOMIC VARIANTS: None. Source images and MIP images were reviewed. IMPRESSION: MRI head:Negative motion degraded noncontrast MRI of the head for age. MRA head: No emergent large vessel occlusion on this motion degraded examination. Electronically Signed   By: Awilda Metro M.D.   On: 10/28/2017 01:18    Medications: Infusions:   Scheduled Medications: . aspirin  300 mg Rectal Daily  . furosemide  80 mg Intravenous Q8H  . hydrocortisone sod succinate (SOLU-CORTEF) inj  50 mg Intravenous Q8H  . insulin aspart  0-9 Units Subcutaneous Q4H  . [START ON 10/29/2017] levothyroxine  50 mcg Intravenous Daily  . metolazone  5 mg Oral Daily  . thiamine injection  100 mg Intravenous Daily    have reviewed scheduled and prn medications.  Physical Exam: General: Morbidly obese female lying on bed, not in distress Heart: Regular rate rhythm, no rubs Lungs: Distant breath sound Abdomen: Abdomen distended, soft Extremities: Bilateral lower extremities pitting edema   Kathryn Howe 10/28/2017,11:30 AM  LOS: 1 day

## 2017-10-29 DIAGNOSIS — E790 Hyperuricemia without signs of inflammatory arthritis and tophaceous disease: Secondary | ICD-10-CM | POA: Diagnosis present

## 2017-10-29 DIAGNOSIS — Z515 Encounter for palliative care: Secondary | ICD-10-CM

## 2017-10-29 DIAGNOSIS — Z7189 Other specified counseling: Secondary | ICD-10-CM

## 2017-10-29 DIAGNOSIS — N179 Acute kidney failure, unspecified: Secondary | ICD-10-CM

## 2017-10-29 LAB — GLUCOSE, CAPILLARY
GLUCOSE-CAPILLARY: 176 mg/dL — AB (ref 65–99)
GLUCOSE-CAPILLARY: 183 mg/dL — AB (ref 65–99)
GLUCOSE-CAPILLARY: 194 mg/dL — AB (ref 65–99)
GLUCOSE-CAPILLARY: 291 mg/dL — AB (ref 65–99)
GLUCOSE-CAPILLARY: 355 mg/dL — AB (ref 65–99)
Glucose-Capillary: 197 mg/dL — ABNORMAL HIGH (ref 65–99)

## 2017-10-29 LAB — PROTIME-INR
INR: 3.95
INR: 3.82
Prothrombin Time: 38.3 seconds — ABNORMAL HIGH (ref 11.4–15.2)
Prothrombin Time: 37.3 seconds — ABNORMAL HIGH (ref 11.4–15.2)

## 2017-10-29 LAB — BASIC METABOLIC PANEL
Anion gap: 17 — ABNORMAL HIGH (ref 5–15)
BUN: 106 mg/dL — AB (ref 6–20)
CO2: 24 mmol/L (ref 22–32)
CREATININE: 5.98 mg/dL — AB (ref 0.44–1.00)
Calcium: 7.1 mg/dL — ABNORMAL LOW (ref 8.9–10.3)
Chloride: 97 mmol/L — ABNORMAL LOW (ref 101–111)
GFR calc Af Amer: 7 mL/min — ABNORMAL LOW (ref >60)
GFR calc non Af Amer: 6 mL/min — ABNORMAL LOW (ref >60)
Glucose, Bld: 311 mg/dL — ABNORMAL HIGH (ref 65–99)
Potassium: 4.3 mmol/L (ref 3.5–5.1)
SODIUM: 138 mmol/L (ref 135–145)

## 2017-10-29 LAB — PHOSPHORUS: Phosphorus: 12.1 mg/dL — ABNORMAL HIGH (ref 2.5–4.6)

## 2017-10-29 LAB — MAGNESIUM: Magnesium: 2.2 mg/dL (ref 1.7–2.4)

## 2017-10-29 MED ORDER — NYSTATIN 100000 UNIT/GM EX POWD
Freq: Two times a day (BID) | CUTANEOUS | Status: DC
  Administered 2017-10-29: 23:00:00 via TOPICAL
  Administered 2017-10-30: 1 g via TOPICAL
  Filled 2017-10-29: qty 15

## 2017-10-29 NOTE — Progress Notes (Addendum)
Patient with poor prognosis. Recommend palliative care consult and hospice support. D/w family yesterday. I will sign off. Please call us with any question.  Addendum: Received call from Palliative care that family wants to talk to me because pt is making some urine and more alert today.   Seen and examined at bedside. Pt is more alert and some clear urine in the bag. Continue current dose of lasix and metolazone. Repeat BMP in am.  Will follow up tomorrow.  D/w pt's family members including son, daughter and husband.

## 2017-10-29 NOTE — Progress Notes (Signed)
[facility] PROGRESS NOTE  Kathryn Howe ZOX:[id number] DOB: 12/22/1940 DOA: 10/27/2017 PCP: [facility]  Brief summary:  Patient transferred from outside hospital for stroke work-up.  She was too big to fit in local hospitals MRI machine.  She had questionable stroke on CT of the head.  MRI MRA here is negative.  EEG at outside facility was negative.  Patient has some seizure-like activities.  Neurology has been consulted.  In addition patient had severe kidney failure on admission along with some anasarca and some respiratory difficulty.  Nephrology was consulted and started palliative diuresis.  The thought was patient stated the discharge moved to hospice as a look was bleak.  Patient however began to improve.  Mental status cleared and urine output increased.  Palliative care is currently following.  Assessing patient day today.  Assessment/Plan:  Acute Encephalopathy (improved) Med related? MRI/MRA neg EEG at outside hospital was reported as negative, requesting medical records, will get neurology consult Still with multi organ system failure prognosis is poor Palliative consult  R side weakness (resolved) MRI/MRA neg Neuro checks  Seizure like movements EEG negative Neuro consult ordered Prn ativam  ARF Comfort care orders for now as patient is responding to diuresis, nephrology continue to follow  HTN Cont to monitor  CHF Monitor I&Os  DM SSI  Code Status: DNR Family Communication: DGTR POA (indicate person spoken with, relationship, and if by phone, the number) Disposition Plan: Hospice   Consultants:  Palliative  Renal  HPI/Subjective: More alert this AM again. No acute events overnight. Spoke to dgtr POA who wants pt to have time to improve.  Objective: Vitals:   10/29/17 1317 10/29/17 1500  BP:  (!) 131/48  Pulse:  93  Resp:  14  Temp: 97.9 F (36.6 C)   SpO2:  100%    Intake/Output Summary (Last 24 hours) at 10/29/2017  1707 Last data filed at 10/29/2017 1547 Gross per 24 hour  Intake 568 ml  Output 577 ml  Net -9 ml   Filed Weights   10/27/17 1719 10/28/17 0421  Weight: 125.5 kg (276 lb 10.8 oz) 123.6 kg (272 lb 7.8 oz)    Exam:  General: NCAT, NAD; anasarca Cardiovascular: RRR, no MRG Respiratory: CTAB, nl wob Abdomen: NS, BS+, NTTP Musculoskeletal: moving all extr, nl tone     Data Reviewed: Basic Metabolic Panel: Recent Labs  Lab 10/27/17 2048 10/28/17 0329  NA 136 138  K 5.6* 5.9*  CL 96* 97*  CO2 25 25  GLUCOSE 185* 160*  BUN 93* 90*  CREATININE 5.58* 5.74*  CALCIUM 7.8* 7.8*  PHOS  --  12.9*   Liver Function Tests: Recent Labs  Lab 10/27/17 2048 10/28/17 0329  AST 22  --   ALT 29  --   ALKPHOS 65  --   BILITOT 0.5  --   PROT 5.5*  --   ALBUMIN 2.7* 2.5*   No results for input(s): LIPASE, AMYLASE in the last 168 hours. No results for input(s): AMMONIA in the last 168 hours. CBC: Recent Labs  Lab 10/27/17 2048 10/28/17 0329  WBC 9.3 8.9  NEUTROABS  --  7.0  HGB 9.0* 8.8*  HCT 28.7* 28.7*  MCV 88.6 88.9  PLT 292 316   Cardiac Enzymes: No results for input(s): CKTOTAL, CKMB, CKMBINDEX, TROPONINI in the last 168 hours. BNP (last 3 results) No results for input(s): BNP in the last 8760 hours.  ProBNP (last 3 results) No results for input(s): PROBNP in the last  8760 hours.  CBG: Recent Labs  Lab 10/28/17 2157 10/29/17 0016 10/29/17 0427 10/29/17 0836 10/29/17 1229  GLUCAP 160* 176* 183* 197* 194*    Recent Results (from the past 240 hour(s))  MRSA PCR Screening     Status: None   Collection Time: 10/28/17  1:27 AM  Result Value Ref Range Status   MRSA by PCR NEGATIVE NEGATIVE Final    Comment:        The GeneXpert MRSA Assay (FDA approved for NASAL specimens only), is one component of a comprehensive MRSA colonization surveillance program. It is not intended to diagnose MRSA infection nor to guide or monitor treatment for MRSA  infections. Performed at [facility], [address] [place], [place] [address]      Studies: Mr Brain 69 Contrast  Result Date: 10/28/2017 CLINICAL DATA:  Altered level consciousness, follow-up stroke. History of hypertension, migraine, stroke and diabetes. EXAM: MRI HEAD WITHOUT CONTRAST MRA HEAD WITHOUT CONTRAST TECHNIQUE: Multiplanar, multiecho pulse sequences of the brain and surrounding structures were obtained without intravenous contrast. Angiographic images of the head were obtained using MRA technique without contrast. COMPARISON:  CT HEAD October 25, 2017. MRI of the head report January 31, 2014 though images are not available for direct comparison. FINDINGS: MRI HEAD FINDINGS-multiple sequences are moderately motion degraded. INTRACRANIAL CONTENTS: No reduced diffusion to suggest acute ischemia. No susceptibility artifact to suggest hemorrhage though motion decreases sensitivity for subtle hemosiderin staining microhemorrhage. Patchy supratentorial white matter changes compatible with mild chronic small vessel ischemic disease, normal for age. The ventricles and sulci are normal for patient's age. No suspicious parenchymal signal, masses, mass effect. No abnormal extra-axial fluid collections. No extra-axial masses. VASCULAR: Normal major intracranial vascular flow voids present at skull base. SKULL AND UPPER CERVICAL SPINE: No abnormal sellar expansion. No suspicious calvarial bone marrow signal. Craniocervical junction maintained. SINUSES/ORBITS: LEFT mastoid effusion. Paranasal sinuses are well aerated.The included ocular globes and orbital contents are non-suspicious. OTHER: Patient is edentulous. MRA HEAD FINDINGS-moderate to severely motion degraded examination. ANTERIOR CIRCULATION: Normal flow related enhancement of the included cervical, petrous, cavernous and supraclinoid internal carotid arteries. Patent anterior communicating artery. Patent anterior and middle cerebral arteries.  Bilateral anterior cerebral arteries arise from RIGHT A1-2 junction. POSTERIOR CIRCULATION: Codominant vertebral arteries. Basilar artery is patent, with normal flow related enhancement of the main branch vessels. Patent posterior cerebral arteries, potentially stenotic RIGHT P1 segment versus motion artifact. Robust RIGHT posterior communicating artery present. ANATOMIC VARIANTS: None. Source images and MIP images were reviewed. IMPRESSION: MRI head:Negative motion degraded noncontrast MRI of the head for age. MRA head: No emergent large vessel occlusion on this motion degraded examination. Electronically Signed   By: Awilda Metro M.D.   On: 10/28/2017 01:18   Dg Chest Port 1 View  Result Date: 10/28/2017 CLINICAL DATA:  Acute respiratory failure. EXAM: PORTABLE CHEST 1 VIEW COMPARISON:  Radiograph of October 24, 2017. FINDINGS: Stable cardiomediastinal silhouette. Mild central pulmonary vascular congestion is noted. No pneumothorax or pleural effusion is noted. No consolidative process is noted. Bony thorax is unremarkable. IMPRESSION: Mild central pulmonary vascular congestion. Electronically Signed   By: Lupita Raider, M.D.   On: 10/28/2017 11:33   Mr Maxine Glenn Head Wo Contrast  Result Date: 10/28/2017 CLINICAL DATA:  Altered level consciousness, follow-up stroke. History of hypertension, migraine, stroke and diabetes. EXAM: MRI HEAD WITHOUT CONTRAST MRA HEAD WITHOUT CONTRAST TECHNIQUE: Multiplanar, multiecho pulse sequences of the brain and surrounding structures were obtained without intravenous contrast. Angiographic  images of the head were obtained using MRA technique without contrast. COMPARISON:  CT HEAD October 25, 2017. MRI of the head report January 31, 2014 though images are not available for direct comparison. FINDINGS: MRI HEAD FINDINGS-multiple sequences are moderately motion degraded. INTRACRANIAL CONTENTS: No reduced diffusion to suggest acute ischemia. No susceptibility artifact to suggest  hemorrhage though motion decreases sensitivity for subtle hemosiderin staining microhemorrhage. Patchy supratentorial white matter changes compatible with mild chronic small vessel ischemic disease, normal for age. The ventricles and sulci are normal for patient's age. No suspicious parenchymal signal, masses, mass effect. No abnormal extra-axial fluid collections. No extra-axial masses. VASCULAR: Normal major intracranial vascular flow voids present at skull base. SKULL AND UPPER CERVICAL SPINE: No abnormal sellar expansion. No suspicious calvarial bone marrow signal. Craniocervical junction maintained. SINUSES/ORBITS: LEFT mastoid effusion. Paranasal sinuses are well aerated.The included ocular globes and orbital contents are non-suspicious. OTHER: Patient is edentulous. MRA HEAD FINDINGS-moderate to severely motion degraded examination. ANTERIOR CIRCULATION: Normal flow related enhancement of the included cervical, petrous, cavernous and supraclinoid internal carotid arteries. Patent anterior communicating artery. Patent anterior and middle cerebral arteries. Bilateral anterior cerebral arteries arise from RIGHT A1-2 junction. POSTERIOR CIRCULATION: Codominant vertebral arteries. Basilar artery is patent, with normal flow related enhancement of the main branch vessels. Patent posterior cerebral arteries, potentially stenotic RIGHT P1 segment versus motion artifact. Robust RIGHT posterior communicating artery present. ANATOMIC VARIANTS: None. Source images and MIP images were reviewed. IMPRESSION: MRI head:Negative motion degraded noncontrast MRI of the head for age. MRA head: No emergent large vessel occlusion on this motion degraded examination. Electronically Signed   By: Awilda Metro M.D.   On: 10/28/2017 01:18    Scheduled Meds: . aspirin  300 mg Rectal Daily  . furosemide  80 mg Intravenous Q8H  . hydrocortisone sod succinate (SOLU-CORTEF) inj  50 mg Intravenous Q8H  . insulin aspart  0-9 Units  Subcutaneous Q4H  . levothyroxine  50 mcg Intravenous Daily  . metolazone  5 mg Oral Daily  . thiamine injection  100 mg Intravenous Daily   Continuous Infusions:  Principal Problem:   Acute metabolic encephalopathy Active Problems:   Acute renal failure (ARF) (HCC)   Adrenal insufficiency (Addison's disease) (HCC)   Benign essential HTN   Hyperkalemia   Right sided weakness   History of DVT (deep vein thrombosis)   Supratherapeutic INR   Goals of care, counseling/discussion   Palliative care by specialist    Time spent: 30    Haydee Salter  [facility] Pager AMION. If 7PM-7AM, please contact night-coverage at [email or web address], password [facility] 10/29/2017, 5:07 PM  LOS: 2 days

## 2017-10-29 NOTE — Consult Note (Addendum)
[facility] CM Primary Care Navigator  10/29/2017  Jacquelin Ditmer Apr 27, 1941 [id number]   Went to see patientat the bedsideto identify possible discharge needs. Met with husband Sonia Side), daughter Guerry Minors- from [place]) and son (Sam- from [place], [place]).  Daughtermentionedthat patient is [facility] [place] since 10/[facility]/17.   Patient's daughterreports that she was having stroke-like symptoms (slurred, non-sensible speech and right-sided weakness)that resultedto this admission.  D[facility] states that[facility] through [facility] service.Husband manages her medications at home using "pill box" system filled once a week. Per daughter, [facility] provided ramp for patient's house and she is being picked-up and dropped off by [facility] medical transportation to and from her doctors' appointments.  According to daughter, patient is being seenand followed-up by physician for the [facility] who serves as the primary care provider, even after discharge.  Per daughter, discharge plan isstill to be determined- skilled nursing facility vs. Hospice facility. Palliative care consulted for establishing goals of care, Inpatient hospice referral and Psychosocial/spiritual support.  Patient's family mentioned that they are well informed in managing her health needs- mainly diabetes (husband managing for the last 20 years).  Nofurther health management needs or concerns noted and indicated at this point.   For additional questions please contact:  Edwena Felty A. Toniette Devera, BSN, RN-BC [facility] PRIMARY CARE Navigator Cell: [phone]

## 2017-10-29 NOTE — Progress Notes (Signed)
No charge note.  Palliative care:  Family meeting scheduled for today 1300  Thank you for this consult.  Gerlean RenShae Lee Tyrea Froberg, DNP, AGNP-C Palliative Medicine Team Team Phone # [phone]

## 2017-10-29 NOTE — Progress Notes (Signed)
Patient foley catheter is leaking.  Patient bed pad is full of urine.  No urine in the bag.  Notified on call NP K. Craige CottaKirby.  Okay to changed the foley catheter.    Will continue to monitor the patient and notify as needed

## 2017-10-29 NOTE — Consult Note (Signed)
Consultation Note Date: 10/29/2017   Patient Name: Kathryn Howe  DOB: April 04, 1941  MRN: [id number]  Age / Sex: 77 y.o., female  PCP: Care, [facility] Senior Referring Physician: Elwin Mocha, MD  Reason for Consultation: Establishing goals of care, Inpatient hospice referral and Psychosocial/spiritual support  HPI/Patient Profile: 77 y.o. female  with past medical history of IDDm, CHF, CVA w/ residual gait disturbance, PE/DVT on coumadin, CKD3, OA, CAD, and dementia admitted on 10/27/2017 after a transfer from [facility]. She was admitted to [facility] 4/20 d/t right sided weakness and slurred speech. Found to have acute renal failure and altered mental status. EEG, CT, and MRI okay. INR elevated. Creatinine and BUN significantly elevated. Patient seen by renal and determined she was not a good HD candidate. Started on lasix and metolazone. On 4/25 has increased urine output and improving mental status.   PMT consulted for goals of care.    Clinical Assessment and Goals of Care: I have reviewed medical records including EPIC notes, labs and imaging, assessed the patient and then met at the bedside along with patient's husband, Sonia Side, daughter, Guerry Minors, and son, Sam,  to discuss diagnosis prognosis, GOC, EOL wishes, disposition and options.  I introduced Palliative Medicine as specialized medical care for people living with serious illness. It focuses on providing relief from the symptoms and stress of a serious illness. The goal is to improve quality of life for both the patient and the family.   As far as functional and nutritional status, patient is dependent in all ADLs. Husband is her sole caregiver. He assists her in and out of bed into a wheelchair. She is unable to ambulate but does get out of bed daily. He gives her sponge baths in bed. No problems with nutrition - family reports good appetite. They also tell me no problems with  cognition - slightly forgetful, but able to participate in daily conversations and long term memory intact.     We discussed their current illness and what it means in the larger context of their on-going co-morbidities.  Specifically we discussed her renal failure and that she may be nearing end of life d/t this. Natural disease trajectory and expectations at EOL were discussed.  The difference between aggressive medical intervention and comfort care was considered in light of the patient's goals of care. Family is interested in comfort care if patient does not improve or declines. However, currently they are encouraged about patient's urine output and improving mental status and would like to treat the treatable for now.  Advanced directives, concepts specific to code status, artifical feeding and hydration, and rehospitalization were considered and discussed. Patient has completed advance directives and I reviewed them. Her husband and children are joint HCPOA. She is DNR.   Hospice and Palliative Care services outpatient were explained and offered.  Questions and concerns were addressed. The family was encouraged to call with questions or concerns.   Primary Decision Maker NEXT OF KIN - joint decisions with husband and children    SUMMARY OF RECOMMENDATIONS   - spoke with nephrology - family requests their involvement - renal panel ordered - family asking about neurology consult d/t AMS and tremor - per primary team - confirmed DNR - confirmed decision for no HD - if patient does not improve or declines, family interested in comfort care - PMT will follow-up tomorrow - family also requesting repeat eval by SLP for diet liberalization now that patient more alert  Code Status/Advance Care Planning:  DNR  Symptom Management:   Denies symptoms such as pain, shortness of breath, anxiety, and nausea  Palliative Prophylaxis:   Aspiration, Delirium Protocol, Frequent Pain Assessment,  Oral Care and Turn Reposition  Additional Recommendations (Limitations, Scope, Preferences):  No Artificial Feeding and No Hemodialysis  Psycho-social/Spiritual:   Desire for further Chaplaincy support:not assessed  Additional Recommendations: Education on Hospice  Prognosis:   Unable to determine  Discharge Planning: To Be Determined      Primary Diagnoses: Present on Admission: . Acute metabolic encephalopathy . Acute renal failure (ARF) ([place]) . Adrenal insufficiency (Addison's disease) ([place]) . Benign essential HTN . Hyperkalemia . Supratherapeutic INR   I have reviewed the medical record, interviewed the patient and family, and examined the patient. The following aspects are pertinent.  Past Medical History:  Diagnosis Date  . Chronic kidney disease    "don't know stage" (10/27/2017)  . Chronic lower back pain   . Depression   . DVT (deep venous thrombosis) ([place]) 2016   LLE  . Fibromyalgia   . History of kidney stones    "passed it"  . Hypertension   . Hyperthyroidism   . Migraine    "none in years" (10/27/2017)  . Pneumonia    "several times" (10/27/2017)  . Seizures ([place])    "when she was a small child" (10/27/2017)  . Stroke [facility]) 2015   "generalized weakness; , equilibrium effected real bad; memory issues since" (10/27/2017)  . TIA (transient ischemic attack)    "several; ongoing" (10/27/2017)  . Type II diabetes mellitus ([place])    Social History   Socioeconomic History  . Marital status: Married    Spouse name: Not on file  . Number of children: Not on file  . Years of education: Not on file  . Highest education level: Not on file  Occupational History  . Not on file  Social Needs  . Financial resource strain: Not on file  . Food insecurity:    Worry: Not on file    Inability: Not on file  . Transportation needs:    Medical: Not on file    Non-medical: Not on file  Tobacco Use  . Smoking status: Never Smoker  . Smokeless tobacco: Never  Used  Substance and Sexual Activity  . Alcohol use: Yes    Alcohol/week: 4.2 oz    Types: 7 Glasses of wine per week  . Drug use: Never  . Sexual activity: Not Currently  Lifestyle  . Physical activity:    Days per week: Not on file    Minutes per session: Not on file  . Stress: Not on file  Relationships  . Social connections:    Talks on phone: Not on file    Gets together: Not on file    Attends religious service: Not on file    Active member of club or organization: Not on file    Attends meetings of clubs or organizations: Not on file    Relationship status: Not on file  Other Topics Concern  . Not on file  Social History Narrative  . Not on file   History reviewed. No pertinent family history. Scheduled Meds: . aspirin  300 mg Rectal Daily  . furosemide  80 mg Intravenous Q8H  . hydrocortisone sod succinate (SOLU-CORTEF) inj  50 mg Intravenous Q8H  . insulin aspart  0-9 Units Subcutaneous Q4H  . levothyroxine  50 mcg Intravenous Daily  . metolazone  5 mg Oral Daily  . thiamine injection  100 mg  Intravenous Daily   Continuous Infusions: PRN Meds:.acetaminophen **OR** acetaminophen (TYLENOL) oral liquid 160 mg/5 mL **OR** acetaminophen, ipratropium-albuterol, LORazepam Allergies  Allergen Reactions  . Clindamycin/Lincomycin     "goes wild" (Clindamycin)  . Morphine And Related     Possibly caused mental status changes   Review of Systems  Unable to perform ROS: Acuity of condition    Physical Exam  Constitutional: She appears lethargic. No distress.  HENT:  Head: Normocephalic and atraumatic.  Cardiovascular: Normal rate and regular rhythm.  Pulmonary/Chest: Effort normal. No accessory muscle usage. No tachypnea. No respiratory distress.  Abdominal: Soft. Bowel sounds are normal.  Musculoskeletal: She exhibits edema.  Neurological: She appears lethargic. She is disoriented.  myoclonus  Skin: She is not diaphoretic.    Vital Signs: BP 114/74   Pulse  (!) 102   Temp 97.9 F (36.6 C) (Oral)   Resp 15   Wt 123.6 kg (272 lb 7.8 oz)   SpO2 100%  Pain Scale: 0-10   Pain Score: 0-No pain   SpO2: SpO2: 100 % O2 Device:SpO2: 100 % O2 Flow Rate: .O2 Flow Rate (L/min): 2 L/min  IO: Intake/output summary:   Intake/Output Summary (Last 24 hours) at 10/29/2017 1433 Last data filed at 10/29/2017 0500 Gross per 24 hour  Intake -  Output 126 ml  Net -126 ml    LBM: Last BM Date: 10/28/17 Baseline Weight: Weight: 125.5 kg (276 lb 10.8 oz) Most recent weight: Weight: 123.6 kg (272 lb 7.8 oz)     Palliative Assessment/Data: PPS 30%     Time In: 1300 Time Out: 1500 Time Total: 120 minutes Greater than 50%  of this time was spent counseling and coordinating care related to the above assessment and plan.  Juel Burrow, DNP, AGNP-C Palliative Medicine Team [phone]

## 2017-10-30 DIAGNOSIS — G9341 Metabolic encephalopathy: Secondary | ICD-10-CM

## 2017-10-30 LAB — RENAL FUNCTION PANEL
ANION GAP: 18 — AB (ref 5–15)
Albumin: 2.6 g/dL — ABNORMAL LOW (ref 3.5–5.0)
BUN: 108 mg/dL — ABNORMAL HIGH (ref 6–20)
CALCIUM: 7.5 mg/dL — AB (ref 8.9–10.3)
CO2: 25 mmol/L (ref 22–32)
Chloride: 97 mmol/L — ABNORMAL LOW (ref 101–111)
Creatinine, Ser: 6.02 mg/dL — ABNORMAL HIGH (ref 0.44–1.00)
GFR calc non Af Amer: 6 mL/min — ABNORMAL LOW (ref >60)
GFR, EST AFRICAN AMERICAN: 7 mL/min — AB (ref >60)
Glucose, Bld: 200 mg/dL — ABNORMAL HIGH (ref 65–99)
POTASSIUM: 4.4 mmol/L (ref 3.5–5.1)
Phosphorus: 13 mg/dL — ABNORMAL HIGH (ref 2.5–4.6)
Sodium: 140 mmol/L (ref 135–145)

## 2017-10-30 LAB — GLUCOSE, CAPILLARY
GLUCOSE-CAPILLARY: 193 mg/dL — AB (ref 65–99)
GLUCOSE-CAPILLARY: 165 mg/dL — AB (ref 65–99)
Glucose-Capillary: 204 mg/dL — ABNORMAL HIGH (ref 65–99)
Glucose-Capillary: 177 mg/dL — ABNORMAL HIGH (ref 65–99)

## 2017-10-30 LAB — PROTIME-INR
INR: 2.78
Prothrombin Time: 29.1 seconds — ABNORMAL HIGH (ref 11.4–15.2)

## 2017-10-30 MED ORDER — HYDROMORPHONE HCL 1 MG/ML IJ SOLN: 0.5000 mg | INTRAMUSCULAR | Status: DC | PRN

## 2017-10-30 MED ORDER — ONDANSETRON HCL 4 MG/2ML IJ SOLN: 4.0000 mg | Freq: Four times a day (QID) | INTRAMUSCULAR | Status: DC | PRN

## 2017-10-30 MED ORDER — LORAZEPAM 2 MG/ML IJ SOLN
0.5000 mg | Freq: Once | INTRAMUSCULAR | Status: DC
  Filled 2017-10-30: qty 1

## 2017-10-30 MED ORDER — INSULIN ASPART 100 UNIT/ML ~~LOC~~ SOLN: 0.0000 [IU] | SUBCUTANEOUS | Status: DC

## 2017-10-30 MED ORDER — FUROSEMIDE 10 MG/ML IJ SOLN: 80.0000 mg | Freq: Two times a day (BID) | INTRAMUSCULAR | 0 refills | Status: AC

## 2017-10-30 NOTE — Progress Notes (Signed)
Daily Progress Note   Patient Name: Kathryn Howe       Date: 10/30/2017 DOB: 10/24/1940  Age: 77 y.o. MRN#: [id number] Attending Physician: Alwyn RenMathews, Elizabeth G, MD Primary Care Physician: Care, [facility] Admit Date: 10/27/2017  Reason for Consultation/Follow-up: Establishing goals of care, Hospice Evaluation and Inpatient hospice referral  Subjective: Drifts off to sleep during conversation, when awakens, panics "I'm so scared, help me"  Length of Stay: 3  Current Medications: Scheduled Meds:  . aspirin  300 mg Rectal Daily  . furosemide  80 mg Intravenous Q8H  . hydrocortisone sod succinate (SOLU-CORTEF) inj  50 mg Intravenous Q8H  . insulin aspart  0-9 Units Subcutaneous Q4H  . levothyroxine  50 mcg Intravenous Daily  . LORazepam  0.5 mg Intravenous Once  . metolazone  5 mg Oral Daily  . nystatin   Topical BID  . thiamine injection  100 mg Intravenous Daily    Continuous Infusions:   PRN Meds: acetaminophen **OR** acetaminophen (TYLENOL) oral liquid 160 mg/5 mL **OR** acetaminophen, HYDROmorphone (DILAUDID) injection, ipratropium-albuterol, LORazepam, ondansetron (ZOFRAN) IV  Physical Exam  Constitutional: She appears lethargic. She appears distressed.  HENT:  Head: Normocephalic and atraumatic.  Cardiovascular: Normal rate and regular rhythm.  Pulmonary/Chest: Effort normal and breath sounds normal. No respiratory distress.  Abdominal: Soft.  Musculoskeletal:  anasarca  Neurological: She appears lethargic.  Periods of confusion  Skin: Skin is warm and dry.  Psychiatric: Her mood appears anxious.            Vital Signs: BP 140/71 (BP Location: Right Arm)   Pulse 89   Temp 98.2 F (36.8 C) (Oral)   Resp 16   Wt 121.6 kg (268 lb 1.3 oz)   SpO2 100%  SpO2:  SpO2: 100 % O2 Device: O2 Device: Nasal Cannula O2 Flow Rate: O2 Flow Rate (L/min): 2 L/min  Intake/output summary:   Intake/Output Summary (Last 24 hours) at 10/30/2017 1200 Last data filed at 10/30/2017 [id number] Gross per 24 hour  Intake 568 ml  Output 1051 ml  Net -483 ml   LBM: Last BM Date: 10/29/17 Baseline Weight: Weight: 125.5 kg (276 lb 10.8 oz) Most recent weight: Weight: 121.6 kg (268 lb 1.3 oz)       Palliative Assessment/Data: PPS 30%    Flowsheet Rows     Most Recent Value  Intake Tab  Referral Department  Hospitalist  Unit at Time of Referral  Intermediate Care Unit  Palliative Care Primary Diagnosis  Nephrology  Date Notified  10/27/17  Palliative Care Type  New Palliative care  Reason for referral  Clarify Goals of Care  Date of Admission  10/27/17  Date first seen by Palliative Care  10/29/17  # of days Palliative referral response time  2 Day(s)  # of days IP prior to Palliative referral  0  Clinical Assessment  Palliative Performance Scale Score  30%  Psychosocial & Spiritual Assessment  Palliative Care Outcomes  Patient/Family meeting held?  Yes  Who was at the meeting?  husband, son, and daughter  Palliative Care Outcomes  Clarified goals of care, Provided psychosocial or spiritual support, Counseled regarding hospice      Patient Active Problem List  Diagnosis Date Noted  . Hyperuricemia 10/29/2017  . Goals of care, counseling/discussion   . Palliative care by specialist   . Acute renal failure (ARF) (HCC) 10/28/2017  . Adrenal insufficiency (Addison's disease) (HCC) 10/28/2017  . Benign essential HTN 10/28/2017  . Hyperkalemia 10/28/2017  . Right sided weakness 10/28/2017  . History of DVT (deep vein thrombosis) 10/28/2017  . Supratherapeutic INR 10/28/2017  . Acute metabolic encephalopathy 10/27/2017    Palliative Care Assessment & Plan   HPI: 77 y.o. female  with past medical history of IDDm, CHF, CVA w/ residual gait disturbance,  PE/DVT on coumadin, CKD3, OA, CAD, and dementia admitted on 10/27/2017 after a transfer from [facility]. She was admitted to [facility] 4/20 d/t right sided weakness and slurred speech. Found to have acute renal failure and altered mental status. EEG, CT, and MRI okay. INR elevated. Creatinine and BUN significantly elevated. Patient seen by renal and determined she was not a good HD candidate. Started on lasix and metolazone. On 4/25 has increased urine output and improving mental status.   PMT consulted for goals of care.    Assessment: F/u meeting with patient and family today- son, Doreatha Martin, daughter, Margaretha Glassing, and husband, Dorene Sorrow. Patient is verbal today, but drifts off during conversation. We discussed patient's labs today and discussed talk with nephrology yesterday. They understand prognosis is very poor and want to focus on comfort/quality of life. They request residential hospice facility.   Recommendations/Plan:  CSW for residential hospice - family requests [facility]  Recommend continue IV lasix upon discharge for patient comfort  Symptom management: dilaudid 0.5 mg q3hr PRN pain/dyspnea, ativan 1-2mg  q4hr PRN anxiety, zofran 4 mg q6hr PRN nausea  Goals of Care and Additional Recommendations:  Limitations on Scope of Treatment: Full Comfort Care  Code Status:  DNR  Prognosis:   < 2 weeks d/t renal failure  Discharge Planning:  Hospice facility  Care plan was discussed with social work, daughter, son, husband, Dr. Jerolyn Center  Thank you for allowing the Palliative Medicine Team to assist in the care of this patient.   Time In: 1000 Time Out: 1130 Total Time 90 minutes Prolonged Time Billed  yes       Greater than 50%  of this time was spent counseling and coordinating care related to the above assessment and plan.  Gerlean Ren, DNP, AGNP-C Palliative Medicine Team Team Phone # [phone]

## 2017-10-30 NOTE — Care Management Important Message (Signed)
Important Message  Patient Details  Name: Kathryn Howe MRN: [id number] Date of Birth: 09/18/1940   Medicare Important Message Given:  Yes    Cydnee Fuquay Stefan ChurchBratton 10/30/2017, 1:49 PM

## 2017-10-30 NOTE — Progress Notes (Signed)
  Speech Language Pathology Treatment: Dysphagia  Patient Details Name: Kathryn Howe MRN: [id number] DOB: 08-27-1940 Today's Date: 10/30/2017 Time: 0932-6712 SLP Time Calculation (min) (ACUTE ONLY): 15 min  Assessment / Plan / Recommendation Clinical Impression  New orders received to reassess swallow as pt with improved MS, complaining about pureed meats.  Pt alert, communicative, quite anxious about environment, positioning, and PO trials.  Pt c/o inability to eat graham crackers, but was able to masticate and swallow the cookies her daughter had in her purse.  Did not demonstrate any further s/s of dysphagia, no difficulty with liquids, but certainly described fear of choking and only allowed very small boluses of solid material.  Pt is for D/C today to residential hospice - recommend allowing regular solid diet to liberalize choices.  D/W daughter the importance of allowing her mother to eat the foods that bring her pleasure; feed cautiously and cease if pt is in obvious discomfort.  Pt/family agree - our services will respectfully sign off.     HPI HPI: Kathryn Lautenschlegeris a 77 y.o.femalewith medical history significant ofIDDM,chronic diastolic CHF,CVA with residual gait disturbance,PE/DVT on Coumadin, CKD stage III,OA,and obesity;who initially [facility] on [facility] withcomplaints of right-sided weakness and slurred speech. The patient's husband helps provide additional history as the patient is unable to at this time. At baseline the patient livesat home with her husband and needs assistance with all ADLs.The day prior the patient had difficulty getting to the restroom and he noted some right-sided weakness. Dinner patient had difficulty swallowing which is not unusual for her. When the patient's husband went to check on her the following morning to see if she needed to use the restroom patient'sspeech was significantly slurred and she sounded as  though she were babbling which was unusual for her. He called EMS and the patient was brought to the hospital. The patient's husband noted a recent change in medications from oxycodone to MS Contin prior to patient's hospitalization. MRI head revealed negative motion degraded noncontrast MRI of the head for      SLP Plan  All goals met;Discharge SLP treatment due to (comment)       Recommendations  Diet recommendations: Regular;Thin liquid Liquids provided via: Straw Medication Administration: Crushed with puree Supervision: Staff to assist with self feeding                Oral Care Recommendations: Oral care BID Follow up Recommendations: None SLP Visit Diagnosis: Dysphagia, unspecified (R13.10) Plan: All goals met;Discharge SLP treatment due to (comment)       GO                Kathryn Howe 10/30/2017, 2:39 PM

## 2017-10-30 NOTE — NC FL2 (Signed)
[place] MEDICAID FL2 LEVEL OF CARE SCREENING TOOL     IDENTIFICATION  Patient Name: Kathryn Howe Birthdate: 04/18/1941 Sex: female Admission Date (Current Location): 10/27/2017  [facility] [place]   Facility and Address:  [facility], [address], [place], [place] [address]      Provider Number: [id number]  Attending Physician Name and Address:  Alwyn RenMathews, Elizabeth G, MD  Relative Name and Phone Number:  Margaretha GlassingLoretta, daughter, [phone]    Current Level of Care: Hospital Recommended Level of Care: Skilled Nursing Facility Prior Approval Number:    Date Approved/Denied:   PASRR Number: [id number]  Discharge Plan: SNF    Current Diagnoses: Patient Active Problem List   Diagnosis Date Noted  . Hyperuricemia 10/29/2017  . Goals of care, counseling/discussion   . Palliative care by specialist   . Acute renal failure (ARF) (HCC) 10/28/2017  . Adrenal insufficiency (Addison's disease) (HCC) 10/28/2017  . Benign essential HTN 10/28/2017  . Hyperkalemia 10/28/2017  . Right sided weakness 10/28/2017  . History of DVT (deep vein thrombosis) 10/28/2017  . Supratherapeutic INR 10/28/2017  . Acute metabolic encephalopathy 10/27/2017    Orientation RESPIRATION BLADDER Height & Weight     Self, Place  O2(Nasal cannula 2L) Incontinent, Indwelling catheter Weight: 121.6 kg (268 lb 1.3 oz) Height:     BEHAVIORAL SYMPTOMS/MOOD NEUROLOGICAL BOWEL NUTRITION STATUS      Incontinent Diet(Please see DC Summary)  AMBULATORY STATUS COMMUNICATION OF NEEDS Skin   Extensive Assist Verbally Normal                       Personal Care Assistance Level of Assistance  Bathing, Feeding, Dressing Bathing Assistance: Maximum assistance Feeding assistance: Limited assistance Dressing Assistance: Limited assistance     Functional Limitations Info             SPECIAL CARE FACTORS FREQUENCY  (Hospice care to follow)                     Contractures      Additional Factors Info  Allergies, Code Status, Psychotropic, Insulin Sliding Scale Code Status Info: DNR Allergies Info: Clindamycin/lincomycin, Morphine And Related Psychotropic Info: Ativan Insulin Sliding Scale Info: Every 4 hours       Current Medications (10/30/2017):  This is the current hospital active medication list Current Facility-Administered Medications  Medication Dose Route Frequency Provider Last Rate Last Dose  . acetaminophen (TYLENOL) tablet 650 mg  650 mg Oral Q4H PRN Madelyn FlavorsSmith, Rondell A, MD       Or  . acetaminophen (TYLENOL) solution 650 mg  650 mg Per Tube Q4H PRN Madelyn FlavorsSmith, Rondell A, MD       Or  . acetaminophen (TYLENOL) suppository 650 mg  650 mg Rectal Q4H PRN Madelyn FlavorsSmith, Rondell A, MD      . aspirin suppository 300 mg  300 mg Rectal Daily Katrinka BlazingSmith, Rondell A, MD   300 mg at 10/30/17 1002  . furosemide (LASIX) injection 80 mg  80 mg Intravenous Q8H Maxie BarbBhandari, Dron Prasad, MD   80 mg at 10/30/17 [id number]  . hydrocortisone sodium succinate (SOLU-CORTEF) 100 MG injection 50 mg  50 mg Intravenous Q8H Smith, Rondell A, MD   50 mg at 10/30/17 [id number]  . insulin aspart (novoLOG) injection 0-9 Units  0-9 Units Subcutaneous Q4H Shaffer, Luvenia ReddenShae Lee N, NP      . ipratropium-albuterol (DUONEB) 0.5-2.5 (3) MG/3ML nebulizer solution 3 mL  3 mL  Nebulization Q4H PRN Madelyn Flavors A, MD      . levothyroxine (SYNTHROID, LEVOTHROID) injection 50 mcg  50 mcg Intravenous Daily Haydee Salter, MD   50 mcg at 10/30/17 0956  . LORazepam (ATIVAN) injection 0.5 mg  0.5 mg Intravenous Once Joylene Draft, NP      . LORazepam (ATIVAN) injection 1-2 mg  1-2 mg Intravenous Q4H PRN Haydee Salter, MD      . metolazone (ZAROXOLYN) tablet 5 mg  5 mg Oral Daily Maxie Barb, MD   5 mg at 10/30/17 1001  . nystatin (MYCOSTATIN/NYSTOP) topical powder   Topical BID Haydee Salter, MD   1 g at 10/30/17 1001  . thiamine (B-1) injection 100 mg  100 mg Intravenous  Daily Madelyn Flavors A, MD   100 mg at 10/30/17 1002     Discharge Medications: Please see discharge summary for a list of discharge medications.  Relevant Imaging Results:  Relevant Lab Results:   Additional Information SSN: [id number]         Hospice to follow  Mearl Latin, LCSWA

## 2017-10-30 NOTE — Progress Notes (Signed)
[facility] by MD order; discharged instructions  review and sent to facility with care notes; IV in place per facility request; facility was called and report was given to the nurse who is going to receive the patient; patient will be transported to facility via [facility].

## 2017-10-30 NOTE — Progress Notes (Signed)
CSW received [facility] from [facility], [place], to [facility]. [facility] and is awaiting transport. CSW spoke with patient's family at bedside. They report being saddened but are hopeful that Hospice will take great care of the patient.   Osborne Cascoadia Kenzlee Fishburn LCSW [phone]

## 2017-10-30 NOTE — Progress Notes (Signed)
Patient is sleeping at this time and family doesn't want to weak her up and they don't want to administer now Ativan. Will continue to monitor.

## 2017-10-30 NOTE — Progress Notes (Signed)
CSW received consult regarding hospice placement. Per patient's [facility] team, Aundra MilletMegan, they normally only approve patient to go to SNF with hospice and later transition them to a hospice facility. Their team will review the case and determine what level the patient can have. They will let CSW know. MD aware. CSW sent referral to [facility] just in case.    Osborne Cascoadia Tait Balistreri LCSW [phone]

## 2017-10-30 NOTE — Progress Notes (Signed)
[facility] will [facility]: [facility]  Anticipated DC date: 10/30/17 Family notified: Daughter at bedside Transport by: Sharin [facility]AR   Per MD patient ready for [facility]. RN, patient, patient's family, and facility notified of DC. Discharge Summary sent to facility. RN given number for report [phone] ask for Charge RN). DC packet on chart. Ambulance transport requested for patient.   CSW signing off.  Kathryn GoldmannNadia Buckley Bradly, LCSW Clinical Social Worker [phone]

## 2017-10-30 NOTE — Progress Notes (Signed)
No charge note: Discussed with the multiple family members, palliative care team.  Patient is being discharged to hospice care and comfort care only.  As per palliative care, patient will go with Lasix for comfort measures.

## 2017-10-30 NOTE — Discharge Summary (Signed)
Physician Discharge Summary  Kathryn Howe OZH:[id number] DOB: 1940/08/02 DOA: 10/27/2017  PCP: [facility]  Admit date: 10/27/2017 Discharge date: 10/30/2017  Admitted From: Home Disposition: Hospice Recommendations for Outpatient Follow-up: Hospice Home Health none Equipment/Devices: Oxygen Discharge Condition: Hospice CODE STATUS DNR/DNI Diet recommendation: Cardiac as tolerated  Brief/Interim Summary:77 y.o. female with medical history significant of IDDM, chronic diastolic CHF, CVA with residual gait disturbance, PE/DVT on Coumadin, CKD stage III, OA, and obesity; who initially presented to [facility] on 4/20 with complaints of right-sided weakness and slurred speech.  The patient's husband helps provide additional history as the patient is unable to at this time.  At baseline the patient lives at home with her husband and needs assistance with all ADLs.  The day prior the patient had difficulty getting to the restroom and he noted some right-sided weakness.  Dinner patient had difficulty swallowing which is not unusual for her.  When the patient's husband went to check on her the following morning to see if she needed to use the restroom patient's speech was significantly slurred and she sounded as though she were babbling which was unusual for her.  He called EMS and the patient was brought to the hospital.  The patient's husband noted a recent change in medications from oxycodone to MS Contin prior to patient's hospitalization.    Discharge Diagnoses:  Principal Problem:   Acute metabolic encephalopathy Active Problems:   Acute renal failure (ARF) (HCC)   Adrenal insufficiency (Addison's disease) (HCC)   Benign essential HTN   Hyperkalemia   Right sided weakness   History of DVT (deep vein thrombosis)   Supratherapeutic INR   Goals of care, counseling/discussion   Palliative care by specialist   Hyperuricemia  1] acute on chronic CKD stage III with ATN  with oliguria-patient has been treated with IV diuresis.  Patient continues on IV Lasix.  In addition to this she has several other complicating history that includes history of DVT and PE on Coumadin, hypothyroidism patient is bedbound, type 2 diabetes CAD history of embolic stroke dementia and diastolic heart failure.  Patient has not made any significant clinical improvement since admission to the hospital.  Patient was seen by palliative care who and nephrology consultation who had long discussions with the patient's family and per family request patient will be discharged to residential hospice today on IV Lasix to keep her comfortable.  Patient is DNR/DNI.  Discharge Instructions  Discharge Instructions    Diet - low sodium heart healthy   Complete by:  As directed    Increase activity slowly   Complete by:  As directed      Allergies as of 10/30/2017      Reactions   Clindamycin/lincomycin    "goes wild" (Clindamycin)   Morphine And Related    Possibly caused mental status changes      Medication List    STOP taking these medications   citalopram 40 MG tablet Commonly known as:  CELEXA   DULoxetine 60 MG capsule Commonly known as:  CYMBALTA   glipiZIDE 10 MG tablet Commonly known as:  GLUCOTROL   HYDROXYZINE HCL PO   insulin glargine 100 UNIT/ML injection Commonly known as:  LANTUS   losartan 50 MG tablet Commonly known as:  COZAAR   PIOGLITAZONE HCL PO   pravastatin 20 MG tablet Commonly known as:  PRAVACHOL   thyroid 60 MG tablet Commonly known as:  ARMOUR   torsemide 20 MG tablet Commonly known as:  DEMADEX   warfarin 4 MG tablet Commonly known as:  COUMADIN     TAKE these medications   furosemide 10 MG/ML injection Commonly known as:  LASIX Inject 8 mLs (80 mg total) into the vein 2 (two) times daily.       Allergies  Allergen Reactions  . Clindamycin/Lincomycin     "goes wild" (Clindamycin)  . Morphine And Related     Possibly caused  mental status changes    Consultations: nephrology  Procedures/Studies: Mr Brain Wo Contrast  Result Date: 10/28/2017 CLINICAL DATA:  Altered level consciousness, follow-up stroke. History of hypertension, migraine, stroke and diabetes. EXAM: MRI HEAD WITHOUT CONTRAST MRA HEAD WITHOUT CONTRAST TECHNIQUE: Multiplanar, multiecho pulse sequences of the brain and surrounding structures were obtained without intravenous contrast. Angiographic images of the head were obtained using MRA technique without contrast. COMPARISON:  CT HEAD October 25, 2017. MRI of the head report January 31, 2014 though images are not available for direct comparison. FINDINGS: MRI HEAD FINDINGS-multiple sequences are moderately motion degraded. INTRACRANIAL CONTENTS: No reduced diffusion to suggest acute ischemia. No susceptibility artifact to suggest hemorrhage though motion decreases sensitivity for subtle hemosiderin staining microhemorrhage. Patchy supratentorial white matter changes compatible with mild chronic small vessel ischemic disease, normal for age. The ventricles and sulci are normal for patient's age. No suspicious parenchymal signal, masses, mass effect. No abnormal extra-axial fluid collections. No extra-axial masses. VASCULAR: Normal major intracranial vascular flow voids present at skull base. SKULL AND UPPER CERVICAL SPINE: No abnormal sellar expansion. No suspicious calvarial bone marrow signal. Craniocervical junction maintained. SINUSES/ORBITS: LEFT mastoid effusion. Paranasal sinuses are well aerated.The included ocular globes and orbital contents are non-suspicious. OTHER: Patient is edentulous. MRA HEAD FINDINGS-moderate to severely motion degraded examination. ANTERIOR CIRCULATION: Normal flow related enhancement of the included cervical, petrous, cavernous and supraclinoid internal carotid arteries. Patent anterior communicating artery. Patent anterior and middle cerebral arteries. Bilateral anterior cerebral  arteries arise from RIGHT A1-2 junction. POSTERIOR CIRCULATION: Codominant vertebral arteries. Basilar artery is patent, with normal flow related enhancement of the main branch vessels. Patent posterior cerebral arteries, potentially stenotic RIGHT P1 segment versus motion artifact. Robust RIGHT posterior communicating artery present. ANATOMIC VARIANTS: None. Source images and MIP images were reviewed. IMPRESSION: MRI head:Negative motion degraded noncontrast MRI of the head for age. MRA head: No emergent large vessel occlusion on this motion degraded examination. Electronically Signed   By: Awilda Metro M.D.   On: 10/28/2017 01:18   Dg Chest Port 1 View  Result Date: 10/28/2017 CLINICAL DATA:  Acute respiratory failure. EXAM: PORTABLE CHEST 1 VIEW COMPARISON:  Radiograph of October 24, 2017. FINDINGS: Stable cardiomediastinal silhouette. Mild central pulmonary vascular congestion is noted. No pneumothorax or pleural effusion is noted. No consolidative process is noted. Bony thorax is unremarkable. IMPRESSION: Mild central pulmonary vascular congestion. Electronically Signed   By: Lupita Raider, M.D.   On: 10/28/2017 11:33   Mr Maxine Glenn Head Wo Contrast  Result Date: 10/28/2017 CLINICAL DATA:  Altered level consciousness, follow-up stroke. History of hypertension, migraine, stroke and diabetes. EXAM: MRI HEAD WITHOUT CONTRAST MRA HEAD WITHOUT CONTRAST TECHNIQUE: Multiplanar, multiecho pulse sequences of the brain and surrounding structures were obtained without intravenous contrast. Angiographic images of the head were obtained using MRA technique without contrast. COMPARISON:  CT HEAD October 25, 2017. MRI of the head report January 31, 2014 though images are not available for direct comparison. FINDINGS: MRI HEAD FINDINGS-multiple sequences are moderately motion degraded. INTRACRANIAL CONTENTS: No reduced diffusion to suggest  acute ischemia. No susceptibility artifact to suggest hemorrhage though motion  decreases sensitivity for subtle hemosiderin staining microhemorrhage. Patchy supratentorial white matter changes compatible with mild chronic small vessel ischemic disease, normal for age. The ventricles and sulci are normal for patient's age. No suspicious parenchymal signal, masses, mass effect. No abnormal extra-axial fluid collections. No extra-axial masses. VASCULAR: Normal major intracranial vascular flow voids present at skull base. SKULL AND UPPER CERVICAL SPINE: No abnormal sellar expansion. No suspicious calvarial bone marrow signal. Craniocervical junction maintained. SINUSES/ORBITS: LEFT mastoid effusion. Paranasal sinuses are well aerated.The included ocular globes and orbital contents are non-suspicious. OTHER: Patient is edentulous. MRA HEAD FINDINGS-moderate to severely motion degraded examination. ANTERIOR CIRCULATION: Normal flow related enhancement of the included cervical, petrous, cavernous and supraclinoid internal carotid arteries. Patent anterior communicating artery. Patent anterior and middle cerebral arteries. Bilateral anterior cerebral arteries arise from RIGHT A1-2 junction. POSTERIOR CIRCULATION: Codominant vertebral arteries. Basilar artery is patent, with normal flow related enhancement of the main branch vessels. Patent posterior cerebral arteries, potentially stenotic RIGHT P1 segment versus motion artifact. Robust RIGHT posterior communicating artery present. ANATOMIC VARIANTS: None. Source images and MIP images were reviewed. IMPRESSION: MRI head:Negative motion degraded noncontrast MRI of the head for age. MRA head: No emergent large vessel occlusion on this motion degraded examination. Electronically Signed   By: Awilda Metroourtnay  Bloomer M.D.   On: 10/28/2017 01:18    (Echo, Carotid, EGD, Colonoscopy, ERCP)    Subjective:   Discharge Exam: Vitals:   10/29/17 2100 10/30/17 0654  BP: (!) 120/54 140/71  Pulse: 80 89  Resp: 16 16  Temp: 99.2 F (37.3 C) 98.2 F (36.8 C)   SpO2: 100% 100%   Vitals:   10/29/17 1317 10/29/17 1500 10/29/17 2100 10/30/17 0654  BP:  (!) 131/48 (!) 120/54 140/71  Pulse:  93 80 89  Resp:  14 16 16   Temp: 97.9 F (36.6 C)  99.2 F (37.3 C) 98.2 F (36.8 C)  TempSrc: Oral  Axillary Oral  SpO2:  100% 100% 100%  Weight:    121.6 kg (268 lb 1.3 oz)    General: Pt is alert, awake, not in acute distress Cardiovascular: RRR, S1/S2 +, no rubs, no gallops Respiratory: CTA bilaterally, no wheezing, no rhonchi Abdominal: Soft, NT, ND, bowel sounds + Extremities: no edema, no cyanosis    The results of significant diagnostics from this hospitalization (including imaging, microbiology, ancillary and laboratory) are listed below for reference.     Microbiology: Recent Results (from the past 240 hour(s))  MRSA PCR Screening     Status: None   Collection Time: 10/28/17  1:27 AM  Result Value Ref Range Status   MRSA by PCR NEGATIVE NEGATIVE Final    Comment:        The GeneXpert MRSA Assay (FDA approved for NASAL specimens only), is one component of a comprehensive MRSA colonization surveillance program. It is not intended to diagnose MRSA infection nor to guide or monitor treatment for MRSA infections. Performed at [facility], [address] [place], [place] [address]      Labs: BNP (last 3 results) No results for input(s): BNP in the last 8760 hours. Basic Metabolic Panel: Recent Labs  Lab 10/27/17 2048 10/28/17 0329 10/29/17 1823 10/30/17 0629  NA 136 138 138 140  K 5.6* 5.9* 4.3 4.4  CL 96* 97* 97* 97*  CO2 25 25 24 25   GLUCOSE 185* 160* 311* 200*  BUN 93* 90* 106* 108*  CREATININE 5.58* 5.74*  5.98* 6.02*  CALCIUM 7.8* 7.8* 7.1* 7.5*  MG  --   --  2.2  --   PHOS  --  12.9* 12.1* 13.0*   Liver Function Tests: Recent Labs  Lab 10/27/17 2048 10/28/17 0329 10/30/17 0629  AST 22  --   --   ALT 29  --   --   ALKPHOS 65  --   --   BILITOT 0.5  --   --   PROT 5.5*  --   --   ALBUMIN 2.7*  2.5* 2.6*   No results for input(s): LIPASE, AMYLASE in the last 168 hours. No results for input(s): AMMONIA in the last 168 hours. CBC: Recent Labs  Lab 10/27/17 2048 10/28/17 0329  WBC 9.3 8.9  NEUTROABS  --  7.0  HGB 9.0* 8.8*  HCT 28.7* 28.7*  MCV 88.6 88.9  PLT 292 316   Cardiac Enzymes: No results for input(s): CKTOTAL, CKMB, CKMBINDEX, TROPONINI in the last 168 hours. BNP: Invalid input(s): POCBNP CBG: Recent Labs  Lab 10/29/17 2044 10/30/17 0102 10/30/17 0422 10/30/17 0802 10/30/17 1202  GLUCAP 291* 204* 193* 165* 177*   D-Dimer No results for input(s): DDIMER in the last 72 hours. Hgb A1c No results for input(s): HGBA1C in the last 72 hours. Lipid Profile No results for input(s): CHOL, HDL, LDLCALC, TRIG, CHOLHDL, LDLDIRECT in the last 72 hours. Thyroid function studies No results for input(s): TSH, T4TOTAL, T3FREE, THYROIDAB in the last 72 hours.  Invalid input(s): FREET3 Anemia work up No results for input(s): VITAMINB12, FOLATE, FERRITIN, TIBC, IRON, RETICCTPCT in the last 72 hours. Urinalysis No results found for: COLORURINE, APPEARANCEUR, LABSPEC, PHURINE, GLUCOSEU, HGBUR, BILIRUBINUR, KETONESUR, PROTEINUR, UROBILINOGEN, NITRITE, LEUKOCYTESUR Sepsis Labs Invalid input(s): PROCALCITONIN,  WBC,  LACTICIDVEN Microbiology Recent Results (from the past 240 hour(s))  MRSA PCR Screening     Status: None   Collection Time: 10/28/17  1:27 AM  Result Value Ref Range Status   MRSA by PCR NEGATIVE NEGATIVE Final    Comment:        The GeneXpert MRSA Assay (FDA approved for NASAL specimens only), is one component of a comprehensive MRSA colonization surveillance program. It is not intended to diagnose MRSA infection nor to guide or monitor treatment for MRSA infections. Performed at [facility], [address], [place], [place] [address]      Time coordinating discharge: Over41  minutes  SIGNED:   Alwyn Ren, MD  [facility] 10/30/2017, 12:45 PM Pager   If 7PM-7AM, please contact night-coverage [email or web address] Password [email or web address]

## 2017-12-05 DEATH — deceased

## 2018-07-13 IMAGING — MR MR MRA HEAD W/O CM
9 of 12 series · 28 of 48 positions shown · non-contrast
Comparison: CT HEAD October 25, 2017.

CLINICAL DATA: Altered level consciousness, follow-up stroke.
History of hypertension, migraine, stroke and diabetes.

EXAM:
MRI HEAD WITHOUT CONTRAST
MRA HEAD WITHOUT CONTRAST
TECHNIQUE: Multiplanar, multiecho pulse sequences of the brain and surrounding
structures were obtained without intravenous contrast. Angiographic
images of the head were obtained using MRA technique without
contrast.

[Series 3: DWI · axial · 3.0mm · 0.94mm/px · z∈[-22,+124]mm · 5 of 97 slices shown (1 of 2)]
[im 1/97]
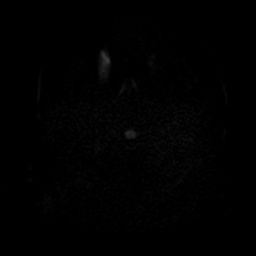
[im 25/97]
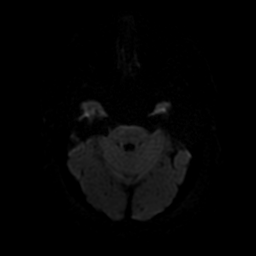
[im 49/97]
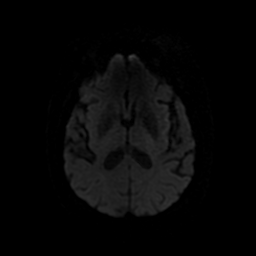
[im 73/97]
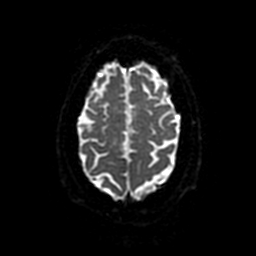
[im 97/97]
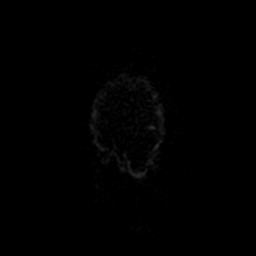

[Series 4: DWI · coronal · 4.0mm · 0.94mm/px · 4 of 76 slices shown (2 of 2)]
[im 1/76]
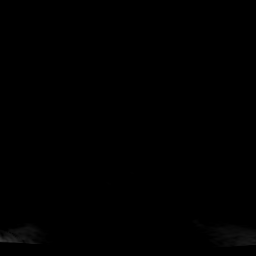
[im 26/76]
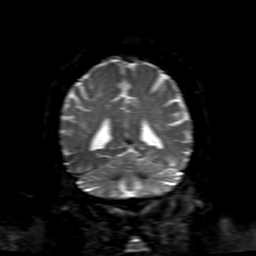
[im 51/76]
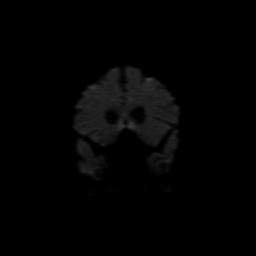
[im 76/76]
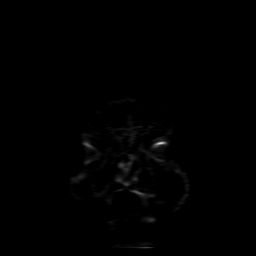

[Series 5: ax (id) 2 · axial · 1.0mm · 0.43mm/px · z∈[-11,+51]mm · 6 of 182 slices shown]
[im 1/182]
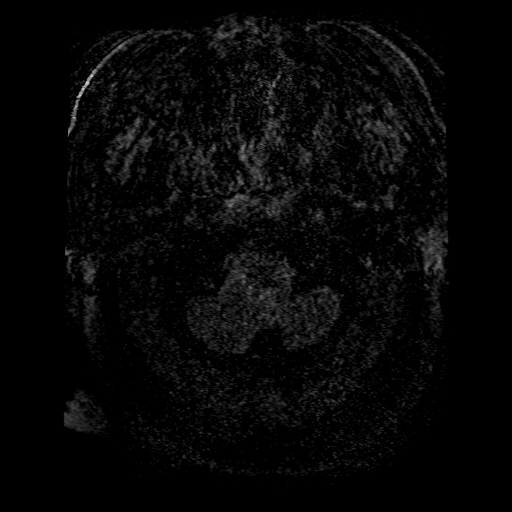
[im 37/182]
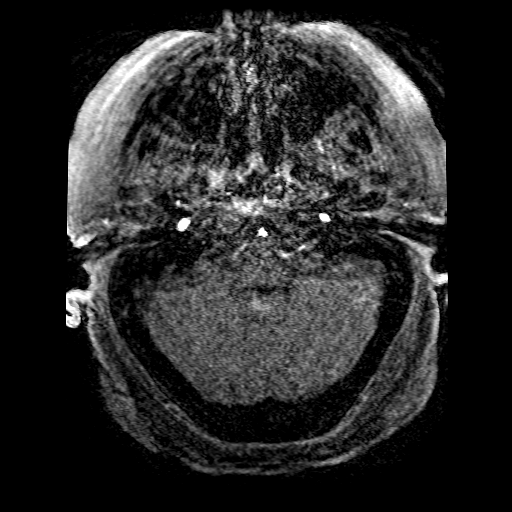
[im 55/182]
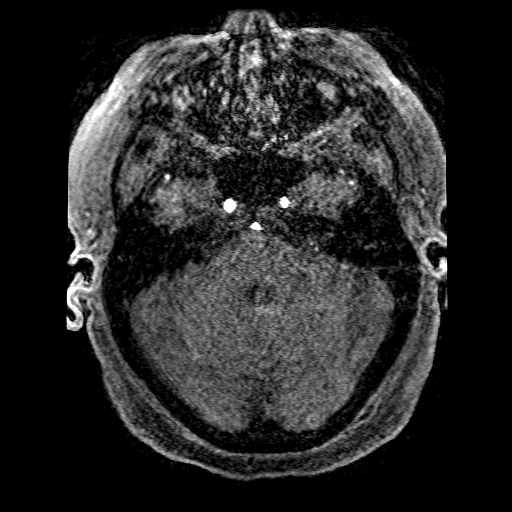
[im 73/182]
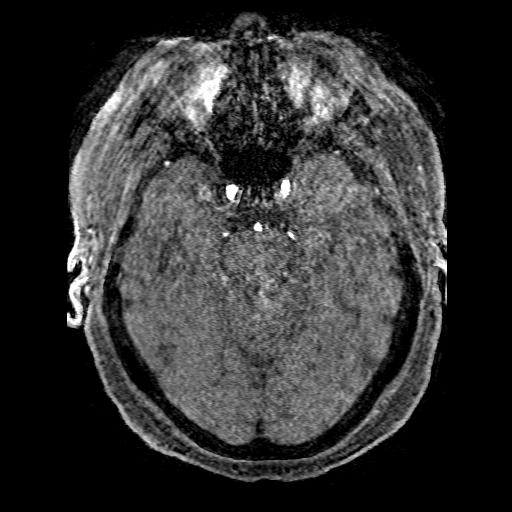
[im 109/182]
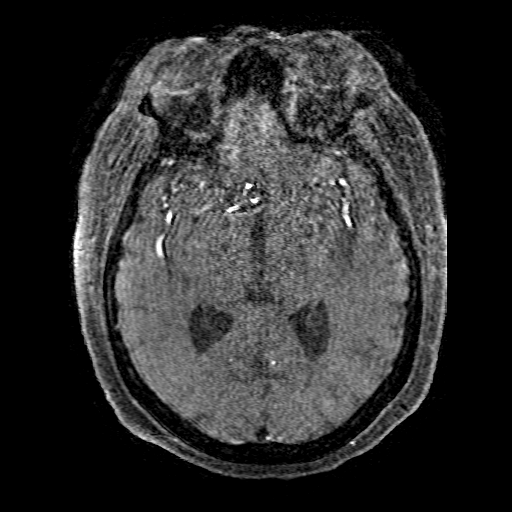
[im 127/182]
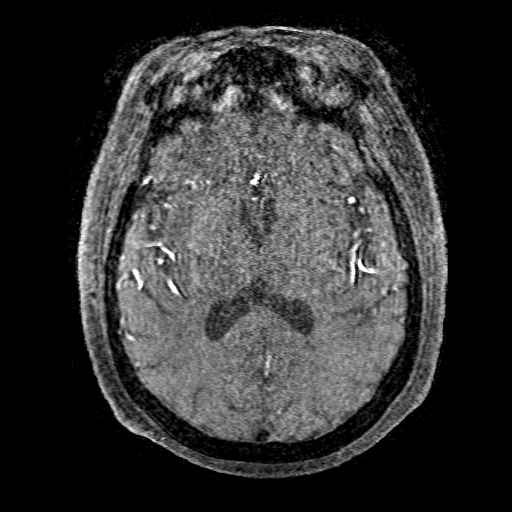

[Series 6: FLAIR · sagittal · 5.0mm · 0.47mm/px · 2 of 26 slices shown (1 of 2)]
[im 1/26]
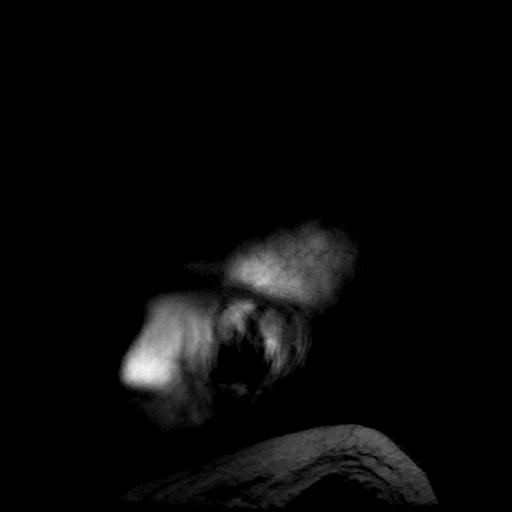
[im 26/26]
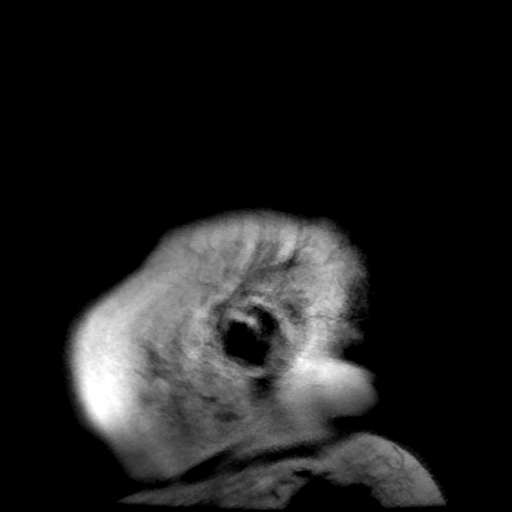

[Series 8: T2 · axial · 5.0mm · 0.47mm/px · z∈[-45,+122]mm · 2 of 29 slices shown (1 of 2)]
[im 1/29]
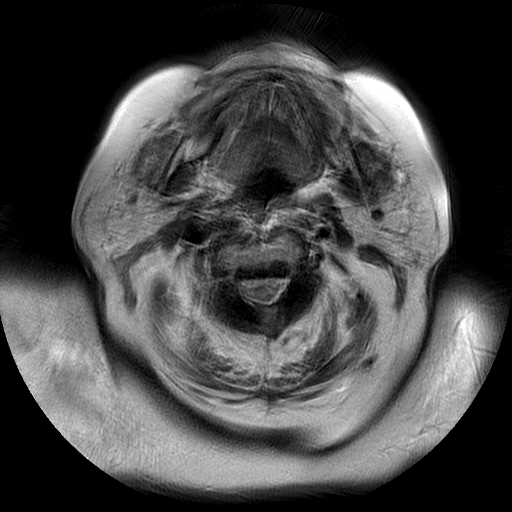
[im 29/29]
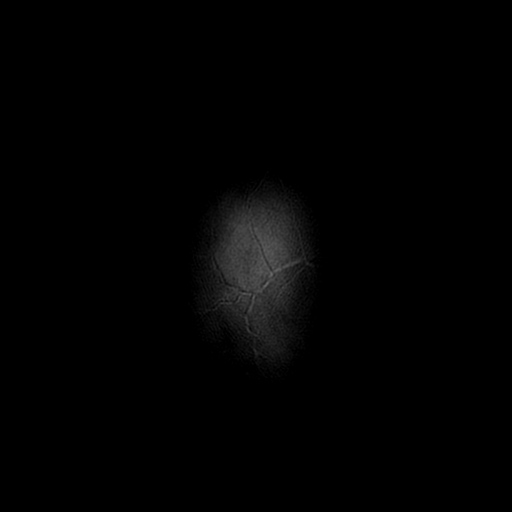

[Series 9: FLAIR · axial · 3.0mm · 0.45mm/px · z∈[-23,+132]mm · 2 of 27 slices shown (2 of 2)]
[im 1/27]
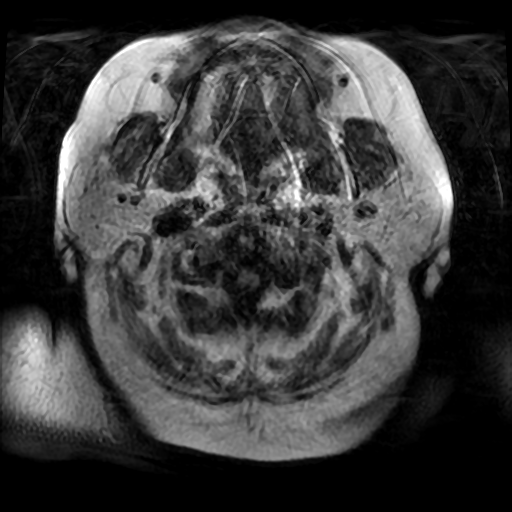
[im 27/27]
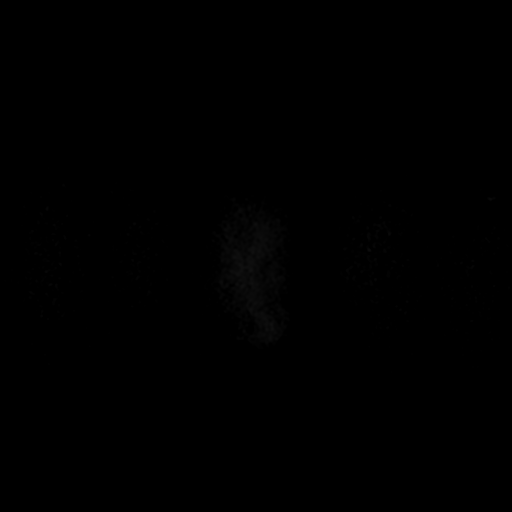

[Series 12: T2 · coronal · 5.0mm · 0.39mm/px · 2 of 30 slices shown (2 of 2)]
[im 1/30]
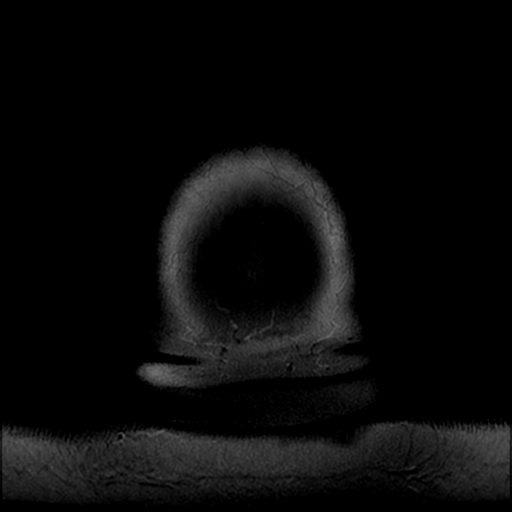
[im 30/30]
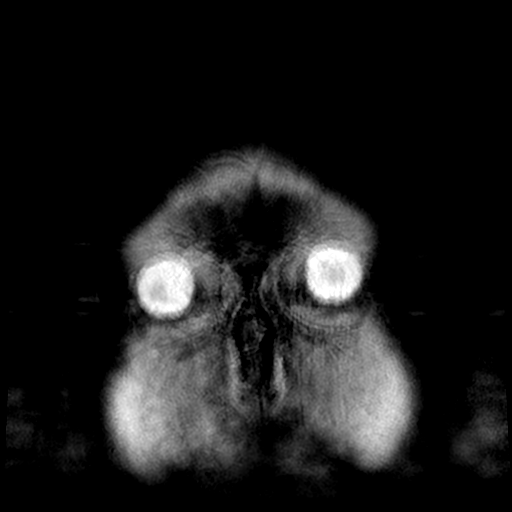

[Series 350: ADC · axial · 3.0mm · 0.94mm/px · z∈[-22,+124]mm · 3 of 50 slices shown (1 of 2)]
[im 1/50]
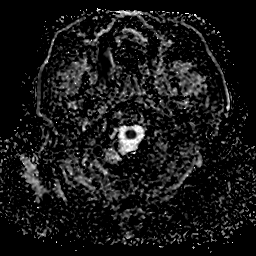
[im 25/50]
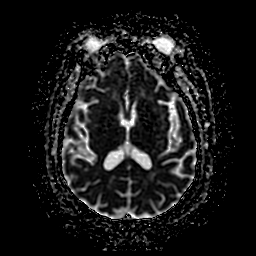
[im 50/50]
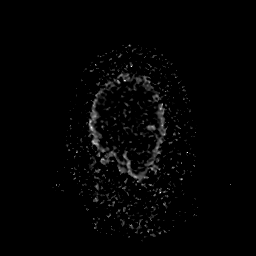

[Series 450: ADC · coronal · 4.0mm · 0.94mm/px · 2 of 38 slices shown (2 of 2)]
[im 1/38]
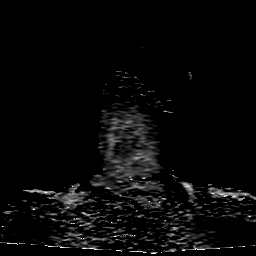
[im 38/38]
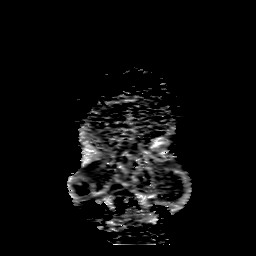

[28 of 48 positions shown; findings below may reference images not displayed]

MRI of the head report January 31, 2014 though images are not available for direct comparison.
FINDINGS: MRI HEAD FINDINGS-multiple sequences are moderately motion degraded.

INTRACRANIAL CONTENTS: No reduced diffusion to suggest acute
ischemia. No susceptibility artifact to suggest hemorrhage though
motion decreases sensitivity for subtle hemosiderin staining
microhemorrhage. Patchy supratentorial white matter changes
compatible with mild chronic small vessel ischemic disease, normal
for age. The ventricles and sulci are normal for patient's age. No
suspicious parenchymal signal, masses, mass effect. No abnormal
extra-axial fluid collections. No extra-axial masses.

VASCULAR: Normal major intracranial vascular flow voids present at
skull base.

SKULL AND UPPER CERVICAL SPINE: No abnormal sellar expansion. No
suspicious calvarial bone marrow signal. Craniocervical junction
maintained.

SINUSES/ORBITS: LEFT mastoid effusion. Paranasal sinuses are well
aerated.The included ocular globes and orbital contents are
non-suspicious.

OTHER: Patient is edentulous.

MRA HEAD FINDINGS-moderate to severely motion degraded examination.

ANTERIOR CIRCULATION: Normal flow related enhancement of the
included cervical, petrous, cavernous and supraclinoid internal
carotid arteries. Patent anterior communicating artery. Patent
anterior and middle cerebral arteries. Bilateral anterior cerebral
arteries arise from RIGHT A1-2 junction.

POSTERIOR CIRCULATION: Codominant vertebral arteries. Basilar artery
is patent, with normal flow related enhancement of the main branch
vessels. Patent posterior cerebral arteries, potentially stenotic
RIGHT P1 segment versus motion artifact. Robust RIGHT posterior
communicating artery present.

ANATOMIC VARIANTS: None.

Source images and MIP images were reviewed.
IMPRESSION: MRI head:Negative motion degraded noncontrast MRI of the head for
age.

MRA head: No emergent large vessel occlusion on this motion degraded
examination.
